# Patient Record
Sex: Male | Born: 1965 | Race: White | Hispanic: No | Marital: Married | Smoking: Never smoker
Health system: Southern US, Community
[De-identification: ages and names within clinical notes are randomized; demographics above are authoritative.]

## PROBLEM LIST (undated history)

## (undated) DIAGNOSIS — E119 Type 2 diabetes mellitus without complications: Secondary | ICD-10-CM

## (undated) DIAGNOSIS — I10 Essential (primary) hypertension: Secondary | ICD-10-CM

## (undated) HISTORY — DX: Essential (primary) hypertension: I10

## (undated) HISTORY — DX: Type 2 diabetes mellitus without complications: E11.9

---

## 2006-02-09 ENCOUNTER — Encounter
Admission: RE | Admit: 2006-02-09 | Discharge: 2006-05-10 | Payer: Self-pay | Admitting: Physical Medicine and Rehabilitation

## 2006-05-11 ENCOUNTER — Encounter
Admission: RE | Admit: 2006-05-11 | Discharge: 2006-08-09 | Payer: Self-pay | Admitting: Physical Medicine and Rehabilitation

## 2006-08-12 ENCOUNTER — Encounter
Admission: RE | Admit: 2006-08-12 | Discharge: 2006-08-17 | Payer: Self-pay | Admitting: Physical Medicine and Rehabilitation

## 2008-01-06 ENCOUNTER — Encounter: Admission: RE | Admit: 2008-01-06 | Discharge: 2008-01-06 | Payer: Self-pay | Admitting: Internal Medicine

## 2010-10-21 ENCOUNTER — Other Ambulatory Visit (INDEPENDENT_AMBULATORY_CARE_PROVIDER_SITE_OTHER): Payer: Self-pay

## 2010-10-21 ENCOUNTER — Telehealth (INDEPENDENT_AMBULATORY_CARE_PROVIDER_SITE_OTHER): Payer: Self-pay

## 2010-10-21 MED ORDER — HYDROCODONE-ACETAMINOPHEN 5-325 MG PO TABS: 1.0000 | ORAL_TABLET | Freq: Four times a day (QID) | ORAL | Status: DC | PRN

## 2010-10-29 ENCOUNTER — Other Ambulatory Visit (INDEPENDENT_AMBULATORY_CARE_PROVIDER_SITE_OTHER): Payer: Self-pay | Admitting: General Surgery

## 2010-10-29 MED ORDER — HYDROCODONE-ACETAMINOPHEN 10-325 MG PO TABS: 1.0000 | ORAL_TABLET | Freq: Four times a day (QID) | ORAL | Status: DC | PRN

## 2010-10-29 MED ORDER — HYDROCODONE-ACETAMINOPHEN 5-325 MG PO TABS: 1.0000 | ORAL_TABLET | Freq: Four times a day (QID) | ORAL | Status: AC | PRN

## 2010-10-30 ENCOUNTER — Other Ambulatory Visit (INDEPENDENT_AMBULATORY_CARE_PROVIDER_SITE_OTHER): Payer: Self-pay

## 2011-01-03 NOTE — Telephone Encounter (Signed)
Encounter resolved.

## 2011-02-18 ENCOUNTER — Ambulatory Visit: Payer: Self-pay | Admitting: Internal Medicine

## 2011-03-15 ENCOUNTER — Ambulatory Visit: Payer: Self-pay | Admitting: Internal Medicine

## 2011-04-15 ENCOUNTER — Ambulatory Visit: Payer: Self-pay | Admitting: Internal Medicine

## 2012-07-06 ENCOUNTER — Ambulatory Visit: Payer: Self-pay | Admitting: General Practice

## 2012-07-07 ENCOUNTER — Encounter: Payer: Self-pay | Admitting: Physician Assistant

## 2012-07-13 ENCOUNTER — Encounter: Payer: Self-pay | Admitting: Physician Assistant

## 2014-02-18 ENCOUNTER — Encounter: Payer: 59 | Attending: Internal Medicine

## 2014-02-18 VITALS — Ht 69.0 in | Wt 213.8 lb

## 2014-02-18 DIAGNOSIS — Z713 Dietary counseling and surveillance: Secondary | ICD-10-CM | POA: Diagnosis not present

## 2014-02-18 DIAGNOSIS — E119 Type 2 diabetes mellitus without complications: Secondary | ICD-10-CM | POA: Diagnosis present

## 2014-02-18 NOTE — Progress Notes (Signed)
Patient was seen on 02/18/14 for the complete diabetes self-management series at the [facility]. This is a part of the Link to [employer].  Handouts given during class include:  Living Well with Diabetes book  Carb Counting and Meal Planning book  Meal Plan Card  Carbohydrate guide  Meal planning worksheet  Low Sodium Flavoring Tips  The diabetes portion plate  Low Carbohydrate Snack Suggestions  A1c to eAG Conversion Chart  Diabetes Medications  Stress Management  Diabetes Recommended Care Schedule  Diabetes Success Plan  Core Class Satisfaction Survey  The following learning objectives were met by the patient during this course:  Describe diabetes  State some common risk factors for diabetes  Defines the role of glucose and insulin  Identifies type of diabetes and pathophysiology  Describe the relationship between diabetes and cardiovascular risk  State the members of the Healthcare Team  States the rationale for glucose monitoring  State when to test glucose  State their individual Target Range  State the importance of logging glucose readings  Describe how to interpret glucose readings  Identifies A1C target  Explain the correlation between A1c and eAG values  State symptoms and treatment of high blood glucose  State symptoms and treatment of low blood glucose  Explain proper technique for glucose testing  Identifies proper sharps disposal  Describe the role of different macronutrients on glucose  Explain how carbohydrates affect blood glucose  State what foods contain the most carbohydrates  Demonstrate carbohydrate counting  Demonstrate how to read Nutrition Facts food label  Describe effects of various fats on heart health  Describe the importance of good nutrition for health and healthy eating strategies  Describe techniques for managing your shopping, cooking and meal planning  List  strategies to follow meal plan when dining out  Describe the effects of alcohol on glucose and how to use it safely . State the amount of activity recommended for healthy living . Describe activities suitable for individual needs . Identify ways to regularly incorporate activity into daily life . Identify barriers to activity and ways to over come these barriers  Identify diabetes medications being personally used and their primary action for lowering glucose and possible side effects . Describe role of stress on blood glucose and develop strategies to address psychosocial issues . Identify diabetes complications and ways to prevent them  Explain how to manage diabetes during illness . Evaluate success in meeting personal goal . Establish 2-3 goals that they will plan to diligently work on until they return for the  87-monthfollow-up visit  Goals:   I will be active 5 times a week  I will test my glucose at least 1 times a day, 7 days a week  Start keeping glucose log  Your patient has identified these potential barriers to change:  Need new glucometer  Your patient has identified their diabetes self-care support plan as  Phone app    Plan: Follow up with Link to [facility]

## 2014-06-19 ENCOUNTER — Ambulatory Visit: Payer: 59

## 2014-08-28 ENCOUNTER — Ambulatory Visit: Payer: Self-pay | Admitting: *Deleted

## 2014-09-18 ENCOUNTER — Ambulatory Visit: Payer: Self-pay | Admitting: *Deleted

## 2014-11-07 ENCOUNTER — Other Ambulatory Visit: Payer: Self-pay | Admitting: *Deleted

## 2014-11-07 VITALS — BP 122/82 | Ht 69.0 in | Wt 208.8 lb

## 2014-11-07 DIAGNOSIS — E1165 Type 2 diabetes mellitus with hyperglycemia: Secondary | ICD-10-CM

## 2014-11-07 LAB — POCT GLYCOSYLATED HEMOGLOBIN (HGB A1C): HEMOGLOBIN A1C: 9

## 2014-11-08 ENCOUNTER — Encounter: Payer: Self-pay | Admitting: *Deleted

## 2014-11-08 NOTE — Patient Outreach (Signed)
[facility]) Care Management   11/07/14  Keith Trevino [birth year] [id number]  Keith Trevino is an 49 y.o. male who presents for routine link To Wellness follow up for self management assistance with Type II DM, HTN and hyperlipidemia.  Subjective:  Keith Trevino says he saw Dr. Donette Larry in early June and was started on Bydureon. He says he is tolerating it with mild GI distress. He is requesting a POC A1C to see if Bydureon has improved his glycemic control. He says he continues to struggle with following a CHO controlled meal plan, checking his blood sugars, and exercising.  Objective:   Review of Systems  Constitutional: Negative.    Filed Weights   11/07/14 1616  Weight: 208 lb 12.8 oz (94.711 kg)   Filed Vitals:   11/07/14 1616  BP: 122/82  POC A1C= 9.0% POC premeal CBG= 167  Physical Exam  Constitutional: He is oriented to person, place, and time.  Neurological: He is alert and oriented to person, place, and time.  Skin: Skin is warm and dry.  Psychiatric: He has a normal mood and affect. His behavior is normal. Thought content normal.    Current Medications:   Current Outpatient Prescriptions  Medication Sig Dispense Refill  . aspirin 81 MG tablet Take 81 mg by mouth daily.    . Exenatide ER (BYDUREON) 2 MG PEN Inject 2 mg into the skin once a week.    . fluticasone (FLONASE) 50 MCG/ACT nasal spray Place 1 spray into both nostrils daily.    Marland Kitchen GLIMEPIRIDE PO Take 4 mg by mouth daily.     Marland Kitchen ibuprofen (ADVIL,MOTRIN) 200 MG tablet Take 200 mg by mouth every 6 (six) hours as needed (two tablets). Two tablets    . metFORMIN (GLUCOPHAGE) 500 MG tablet Take 500 mg by mouth 2 (two) times daily with a meal.    . Multiple Vitamin (MULTIVITAMIN) tablet Take 1 tablet by mouth daily.    Marland Kitchen RAMIPRIL PO Take 5 mg by mouth daily.     . simvastatin (ZOCOR) 10 MG tablet Take 10 mg by mouth daily.    . sitaGLIPtin (JANUVIA) 100 MG tablet Take 100 mg by mouth daily.    .  pioglitazone (ACTOS) 15 MG tablet Take 15 mg by mouth daily.    . sitaGLIPtin-metformin (JANUMET) 50-500 MG per tablet Take 1 tablet by mouth 2 (two) times daily with a meal.     No current facility-administered medications for this visit.    Functional Status:   In your present state of health, do you have any difficulty performing the following activities: 11/07/2014  Hearing? N  Vision? N  Difficulty concentrating or making decisions? N  Walking or climbing stairs? N  Dressing or bathing? N  Doing errands, shopping? N    Fall/Depression Screening:    PHQ 2/9 Scores 11/07/2014 02/18/2014  PHQ - 2 Score 0 0   [facility] CM Care Plan Problem One        Patient Outreach from 11/07/2014 in [facility] [employer]   Care Plan Problem One  Type II DM not meeting A1C target as evidenced by POC A1C= 9.0% on 11/07/14, hyperlipidemia and HTN- meeting treatment targets   Care Plan for Problem One  Active   [facility]N Long Term Goal (31-90 days)  Improved glycemic control as evidenced by improved  A1C (<9.0%) at next assessment, ongoing good control of HTN and hyperlipidemia as evidenced by meeting treatment targets at future assessments   [facility] Long  Term Goal Start Date  11/07/14   Interventions for Problem One Long Term Goal  Using a picture representation, discussed the 8 core pathophysiologic deficits in Type II diabetes. Reviewed physiology of diabetes as a chronic progressive disease with the initial problem of insulin resistance in the muscle, liver and fat cells and then increased loss of beta cell function over time resulting in decreased insulin production, reviewed role of obesity, especially abdominal (visceral) obesity, on insulin resistance, reviewed patient medications, reviewed DM medications of Januvia, Metformin, Glimepiride and extensive discussion of Bydureon including the mechanism of action, common side effects, dosages and dosing schedule, reinforced importance of taking all medications as  prescribed, discussed the need for the use of a combination of DM medications to correct the pathophysiologic core deficits and to  prevent or slow beta cell failure, discussed role of statins, aspirin, and blood pressure medicines in the treatment of diabetes, reviewed recommended target ranges for pre-meal and post-meal, informed patient that True Metrix meter and supplies will replace the True Result in the Link To Wellness program, discussed results of today's POC A1C, A1C goal, and correlation to estimated average glucose, reviewed upcoming appointments with patient's primary care MD and reinforced the importance of keeping the appointment,  will arrange Link to Wellness follow up after Keith Trevino sees Dr Donette Larry on 12/06/14     Assessment:   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  Plan:  RNCM to fax today's office visit note to Dr. Donette Larry. RNCM will meet at least quarterly and as needed with patient per Link To Wellness program guidelines to assist with Type II DM, HTN, and hyperlipidemia self-management and assess patient's progress toward mutually set goals.  [name] RN,CCM,CDE [facility] Care Management Coordinator Link To Wellness Office Phone [phone] Office Fax [phone]

## 2014-11-13 ENCOUNTER — Encounter: Payer: Self-pay | Admitting: *Deleted

## 2015-01-04 ENCOUNTER — Other Ambulatory Visit: Payer: Self-pay | Admitting: *Deleted

## 2015-01-04 NOTE — Patient Outreach (Signed)
Spoke with Keith Trevino via his mobile phone. He says he did not tolerate the Trulicity either due to abdominal pain so he has an appointment to see Dr. Leslie Dales in October. This RNCM requested he call with an update after he sees endocrinologist so that timing of Link To Wellness follow up can be determined and arranged. [name] RN,CCM,CDE [facility] Care Management Coordinator Link To Wellness Office Phone [phone] Office Fax [phone]

## 2015-01-04 NOTE — Patient Outreach (Signed)
[name] saw Dr. Donette Larry for complaints of abdominal pain after starting Bydureon in early June. Bydureon was stopped and patient was given samples of Trulicity to start once weekly. He is to see Dr. Donette Larry in follow up in 3 weeks. [name] RN,CCM,CDE [facility] Care Management Coordinator Link To Wellness Office Phone [phone] Office Fax [phone]

## 2015-01-04 NOTE — Addendum Note (Signed)
Addended by: [name] on: 01/04/2015 04:52 AM   Modules accepted: Medications

## 2015-02-20 ENCOUNTER — Other Ambulatory Visit: Payer: Self-pay | Admitting: *Deleted

## 2015-02-20 NOTE — Patient Outreach (Signed)
Spoke with Keith Trevino via mobile phone to get update on his treatment plan after he saw Dr. Leslie DalesAltheimer for the first time. Says he was started on Tanzeum 30 mg SQ every week and he took his first dose yesterday. He will return to see Dr. Leslie DalesAltheimer on 03/18/15. Will call him for update after he sees MD on 03/18/15. Bary RichardJanet S. Hauser RN,CCM,CDE [facility] Care Management Coordinator Link To Wellness Office Phone [phone] Office Fax [phone]

## 2015-03-21 ENCOUNTER — Other Ambulatory Visit: Payer: Self-pay | Admitting: *Deleted

## 2015-03-21 NOTE — Patient Outreach (Signed)
Spoke with Keith Trevino by phone to get update on his treatment plan for Type II DM since he saw Dr. Leslie DalesAltheimer on 12/6. Keith Trevino says he was told the Tanzeum , that was started on 11/7,  is not helping his glycemic control so Dr. Leslie DalesAltheimer placed a continuous glucose monitor and Keith Trevino will return in 2 weeks. Reviewed Link To Wellness benefit with Keith Trevino regarding 100% coverage of the device should he and his MD decided this would be of benefit. Will plan to call Keith Trevino in 2 weeks for another update. Bary RichardJanet S. Madason Rauls RN,CCM,CDE [facility] Care Management Coordinator Link To Wellness Office Phone [phone] Office Fax [phone]

## 2015-05-01 MED FILL — TANZEUM 50 MG PEN INJECT: 50 | 30 days supply | Qty: 4 | Fill #2

## 2015-05-07 ENCOUNTER — Other Ambulatory Visit: Payer: Self-pay

## 2015-05-07 NOTE — Patient Outreach (Signed)
[facility]) Care Management  05/07/2015  Keith Trevino 26-Nov-1965 [id number]  [name] called me by phone to discuss recent medications his endocrinologist has ordered and future plan of care.  He has not heard back from his MD since the beginning of December- he's frustrated and asking for advice.  1. Call MD and schedule a follow up appointment to review continuous glucose monitor results 2. If MD retries a GLP-1 Agonist, ask for an anti-emetic such as Zofran 3. Ask MD about Theodis Sato which is available to [facility] employees at 0 cost with a pharmacy coupon- cannot access labs     to determine kidney function- will need to discuss with MD   Susette Racer, RN, BA, MHA, CDE [facility] Diabetes Coordinator- Link To Wellness Direct Dial:  [phone]  Fax:  [phone] E-mail: [email or web address] [address], [place], [place]  [address]

## 2015-05-08 MED FILL — GLIMEPIRIDE 4 MG TABLET: 4 | 30 days supply | Qty: 60 | Fill #3

## 2015-05-08 MED FILL — SIMVASTATIN 10 MG TABLET: 10 | 90 days supply | Qty: 90 | Fill #1

## 2015-05-08 MED FILL — JANUVIA 100 MG TABLET: 100 | 90 days supply | Qty: 90 | Fill #1

## 2015-05-15 DIAGNOSIS — E1165 Type 2 diabetes mellitus with hyperglycemia: Secondary | ICD-10-CM | POA: Diagnosis not present

## 2015-05-15 DIAGNOSIS — I10 Essential (primary) hypertension: Secondary | ICD-10-CM | POA: Diagnosis not present

## 2015-05-15 DIAGNOSIS — E559 Vitamin D deficiency, unspecified: Secondary | ICD-10-CM | POA: Diagnosis not present

## 2015-05-15 DIAGNOSIS — E7800 Pure hypercholesterolemia, unspecified: Secondary | ICD-10-CM | POA: Diagnosis not present

## 2015-05-18 ENCOUNTER — Other Ambulatory Visit: Payer: Self-pay | Admitting: *Deleted

## 2015-05-18 NOTE — Patient Outreach (Addendum)
Spoke with [name] to arrange Link To Wellness follow up. States he saw Dr. Leslie Dales and was started on Invokana 100 mg x 2 weeks then 300 mg x 2 weeks. He will see MD again at the end of February. Scheduled Link To Wellness follow up appointment on 06/19/15.  [name] RN,CCM,CDE [facility] Care Management Coordinator Link To Wellness Office Phone [phone] Office Fax [phone]

## 2015-06-04 MED FILL — GLIMEPIRIDE 4 MG TABLET: 4 | 30 days supply | Qty: 60 | Fill #4

## 2015-06-04 MED FILL — RAMIPRIL 5 MG CAPSULE: 5 | 90 days supply | Qty: 90 | Fill #0

## 2015-06-04 MED FILL — TANZEUM 50 MG PEN INJECT: 50 | 30 days supply | Qty: 4 | Fill #3

## 2015-06-06 MED FILL — INVOKANA 300 MG TABLET: 300 | 30 days supply | Qty: 30 | Fill #0

## 2015-06-19 ENCOUNTER — Encounter: Payer: Self-pay | Admitting: *Deleted

## 2015-06-19 ENCOUNTER — Other Ambulatory Visit: Payer: Self-pay | Admitting: *Deleted

## 2015-06-19 ENCOUNTER — Ambulatory Visit: Payer: Self-pay | Admitting: *Deleted

## 2015-06-19 NOTE — Patient Outreach (Signed)
[facility]) Care Management   06/19/2015  Keith Trevino 08-25-1965 [id number]  Keith Pelimothy J Beman is an 50 y.o. male who [facility] office for routine Link To Wellness follow up for self management assistance with Type II DM and hyperlipidemia.  Subjective:  Keith Trevino attributes his weight loss of 9 lbs to healthier food choices and the effects of the new DM medications of Tanzeum and Invokana. He says he will have lab work drawn on 3/20 and then see Dr. Joyce GrossAlthiemer later that week.  Objective:   Review of Systems  Constitutional: Negative.     Physical Exam  Constitutional: He is oriented to person, place, and time. He appears well-developed and well-nourished.  Respiratory: Effort normal.  Neurological: He is alert and oriented to person, place, and time.  Skin: Skin is warm and dry.  Psychiatric: He has a normal mood and affect. His behavior is normal. Judgment and thought content normal.   Filed Vitals:   06/19/15 1201  BP: 110/84   Filed Weights   06/19/15 1201  Weight: 199 lb (90.266 kg)   Current Medications:   Current Outpatient Prescriptions  Medication Sig Dispense Refill  . Albiglutide (TANZEUM) 30 MG PEN Inject 30 mg into the skin.    Marland Kitchen. aspirin 81 MG tablet Take 81 mg by mouth daily.    . canagliflozin (INVOKANA) 100 MG TABS tablet Take 300 mg by mouth daily before breakfast.     . fluticasone (FLONASE) 50 MCG/ACT nasal spray Place 1 spray into both nostrils daily.    Marland Kitchen. GLIMEPIRIDE PO Take 4 mg by mouth 2 (two) times daily.     Marland Kitchen. ibuprofen (ADVIL,MOTRIN) 200 MG tablet Take 200 mg by mouth every 6 (six) hours as needed (two tablets). Two tablets    . metFORMIN (GLUCOPHAGE) 500 MG tablet Take 500 mg by mouth 2 (two) times daily with a meal.    . Multiple Vitamin (MULTIVITAMIN) tablet Take 1 tablet by mouth daily.    Marland Kitchen. RAMIPRIL PO Take 5 mg by mouth daily.     . simvastatin (ZOCOR) 10 MG  tablet Take 10 mg by mouth daily.    . sitaGLIPtin (JANUVIA) 100 MG tablet Take 100 mg by mouth daily.     No current facility-administered medications for this visit.    Functional Status:   In your present state of health, do you have any difficulty performing the following activities: 06/19/2015 11/07/2014  Hearing? N N  Vision? N N  Difficulty concentrating or making decisions? N N  Walking or climbing stairs? N N  Dressing or bathing? N N  Doing errands, shopping? N N    Fall/Depression Screening:    PHQ 2/9 Scores 11/07/2014 02/18/2014  PHQ - 2 Score 0 0    Assessment:   [employer] employee and Link To Wellness member with Type II DM, and hyperlipidemia. Last POC Hgb A1C= 9.0% on 11/07/14, lipid profile of 05/22/14 normal.  [facility]  [facility]        Most Recent Value   Care Plan Problem One  Type II DM not meeting A1C target as evidenced by POC Hgb A1C= 9.0% on 11/07/14, hyperlipidemia and HTN- meeting treatment targets   Role Documenting the Problem One  Care Management Coordinator   Care Plan for Problem One  Active   [facility] Long Term Goal (31-90 days)  Improved glycemic control as evidenced by improved  Hgb A1C (<9.0%)  at next assessment, ongoing good control of HTN and hyperlipidemia as evidenced by meeting treatment targets at future assessments   [facility] Long Term Goal Start Date  06/19/15   Interventions for Problem One Long Term Goal  Using a diagram  of DeFronzo'a Ominous Octet, reviewed the 8 core pathophysiologic deficits in Type II diabetes,  reviewed role of obesity, especially abdominal (visceral) obesity, on insulin resistance, congratulated Keith Trevino on his weight loss and discussed strategies for ongoing weight loss, reviewed patient medications, reviewed DM medications of Januvia, Metformin, Glimepiride, Tanzeum  and Invokana including the mechanism of action, common side effects, dosages and dosing schedule, reinforced importance of taking all medications as prescribed,  discussed the need for the use of a combination of DM medications to correct the pathophysiologic core deficits and to  prevent or slow beta cell failure, reviewed glucometer history and discussed outliers and strategies to reduce post meal glucose spikes, reviewed recommended target ranges for pre-meal and post-meal, reviewed upcoming appointment with endocrinologist and requested he e-mail results of labs and any changes to his DM treatment  plan  to this RNCM, reinforced the importance of keeping the appointment,  will arrange Link to Wellness follow up after Keith Trevino sees Dr Altheimer in 2  weeks     RNCM to fax today's office visit note to Dr. Donette Larry. RNCM will meet quarterly and as needed with patient per Link To Wellness program guidelines to assist with Type II DM and hyperlipidemia self-management and assess patient's progress toward mutually set goals.

## 2015-06-25 DIAGNOSIS — E1165 Type 2 diabetes mellitus with hyperglycemia: Secondary | ICD-10-CM | POA: Diagnosis not present

## 2015-07-02 DIAGNOSIS — E559 Vitamin D deficiency, unspecified: Secondary | ICD-10-CM | POA: Diagnosis not present

## 2015-07-02 DIAGNOSIS — E7800 Pure hypercholesterolemia, unspecified: Secondary | ICD-10-CM | POA: Diagnosis not present

## 2015-07-02 DIAGNOSIS — E1165 Type 2 diabetes mellitus with hyperglycemia: Secondary | ICD-10-CM | POA: Diagnosis not present

## 2015-07-02 DIAGNOSIS — I10 Essential (primary) hypertension: Secondary | ICD-10-CM | POA: Diagnosis not present

## 2015-07-02 MED FILL — GLIMEPIRIDE 4 MG TABLET: 4 | 30 days supply | Qty: 60 | Fill #5

## 2015-07-02 MED FILL — TANZEUM 50 MG PEN INJECT: 50 | 30 days supply | Qty: 4 | Fill #4

## 2015-07-12 MED FILL — INVOKANA 300 MG TABLET: 300 | 90 days supply | Qty: 90 | Fill #1

## 2015-07-12 MED FILL — metFORMIN HCL 500 MG TABS: 500 | 90 days supply | Qty: 180 | Fill #0

## 2015-07-30 MED FILL — TANZEUM 50 MG PEN INJECT: 50 | 30 days supply | Qty: 4 | Fill #5

## 2015-08-03 MED FILL — GLIMEPIRIDE 4 MG TABLET: 4 | 30 days supply | Qty: 60 | Fill #0

## 2015-08-03 MED FILL — JANUVIA 100 MG TABLET: 100 | 90 days supply | Qty: 90 | Fill #0

## 2015-08-31 MED FILL — TANZEUM 50 MG PEN INJECT: 50 | 30 days supply | Qty: 4 | Fill #6

## 2015-08-31 MED FILL — GLIMEPIRIDE 4 MG TABLET: 4 | 30 days supply | Qty: 60 | Fill #1

## 2015-09-04 DIAGNOSIS — H40052 Ocular hypertension, left eye: Secondary | ICD-10-CM | POA: Diagnosis not present

## 2015-09-04 DIAGNOSIS — H40051 Ocular hypertension, right eye: Secondary | ICD-10-CM | POA: Diagnosis not present

## 2015-09-11 MED FILL — RAMIPRIL 5 MG CAPSULE: 5 | 90 days supply | Qty: 90 | Fill #1

## 2015-09-11 MED FILL — SIMVASTATIN 10 MG TABLET: 10 | 90 days supply | Qty: 90 | Fill #0

## 2015-09-21 ENCOUNTER — Other Ambulatory Visit: Payer: Self-pay | Admitting: *Deleted

## 2015-09-21 NOTE — Patient Outreach (Signed)
Spoke with Keith Trevino via mobile phone to arrange Link To Wellness follow up. He states he missed his last appointment with Keith Trevino because he was at the beach. He says he is over due to see Keith Trevino so he will call and make an appointment with him for this month and then provide this RNCM with an update to determine timing of the next Link To Wellness visit. Bary RichardJanet S. Hauser RN,CCM,CDE [facility] Care Management Coordinator Link To Wellness Office Phone [phone] Office Fax [phone]

## 2015-09-24 MED FILL — TANZEUM 50 MG PEN INJECT: 50 | 30 days supply | Qty: 4 | Fill #7

## 2015-10-04 DIAGNOSIS — E1165 Type 2 diabetes mellitus with hyperglycemia: Secondary | ICD-10-CM | POA: Diagnosis not present

## 2015-10-04 DIAGNOSIS — I10 Essential (primary) hypertension: Secondary | ICD-10-CM | POA: Diagnosis not present

## 2015-10-04 DIAGNOSIS — E7800 Pure hypercholesterolemia, unspecified: Secondary | ICD-10-CM | POA: Diagnosis not present

## 2015-10-05 DIAGNOSIS — I10 Essential (primary) hypertension: Secondary | ICD-10-CM | POA: Diagnosis not present

## 2015-10-05 DIAGNOSIS — E559 Vitamin D deficiency, unspecified: Secondary | ICD-10-CM | POA: Diagnosis not present

## 2015-10-05 DIAGNOSIS — E1165 Type 2 diabetes mellitus with hyperglycemia: Secondary | ICD-10-CM | POA: Diagnosis not present

## 2015-10-05 DIAGNOSIS — E7800 Pure hypercholesterolemia, unspecified: Secondary | ICD-10-CM | POA: Diagnosis not present

## 2015-10-10 MED FILL — INVOKANA 300 MG TABLET: 300 | 90 days supply | Qty: 90 | Fill #2

## 2015-10-10 MED FILL — metFORMIN HCL 500 MG TABS: 500 | 90 days supply | Qty: 180 | Fill #1

## 2015-10-22 MED FILL — TANZEUM 50 MG PEN INJECT: 50 | 30 days supply | Qty: 4 | Fill #8

## 2015-10-25 MED FILL — GLIMEPIRIDE 4 MG TABLET: 4 | 30 days supply | Qty: 60 | Fill #2

## 2015-10-26 MED FILL — JANUVIA 100 MG TABLET: 100 | 90 days supply | Qty: 90 | Fill #1

## 2015-11-08 ENCOUNTER — Ambulatory Visit: Payer: Self-pay | Admitting: *Deleted

## 2015-11-09 ENCOUNTER — Ambulatory Visit: Payer: Self-pay | Admitting: *Deleted

## 2015-11-14 ENCOUNTER — Ambulatory Visit: Payer: Self-pay | Admitting: *Deleted

## 2015-11-19 MED FILL — TANZEUM 50 MG PEN INJECT: 50 | 30 days supply | Qty: 4 | Fill #9

## 2015-11-23 ENCOUNTER — Other Ambulatory Visit: Payer: Self-pay | Admitting: *Deleted

## 2015-11-23 ENCOUNTER — Ambulatory Visit: Payer: Self-pay | Admitting: *Deleted

## 2015-11-27 NOTE — Patient Outreach (Signed)
[facility]) Care Management   11/23/2015  Keith Trevino 1965/09/20 [id number]  Keith Trevino is an 50 y.o. male who presents to the [address] [facility] Care Management office for routine Link To Wellness follow up for self management assistance with Type II DM, HTN, hyperlipidemia and obesity.  Subjective: Keith Trevino says he will be taking a family medical leave from work to care for his elderly Mother as she needs 24 hour care due to multiple chronic health issues, including a left below the knee amputation.  [facility] RN and CSW to help him secure resources for his mother before he returns back to work.  He says he saw Dr. Leslie DalesAltheimer on June 23 rd and he brings the after visit summary and lab results with him. He says his eye exam will be due in November.   Objective:   Review of Systems  Constitutional: Negative.    Filed Weights   11/23/15 0839  Weight: 197 lb 12.8 oz (89.7 kg)   Vitals:   11/23/15 0839  BP: 102/70   Physical Exam  Constitutional: He is oriented to person, place, and time. He appears well-developed and well-nourished.  Respiratory: Effort normal.  Neurological: He is alert and oriented to person, place, and time.  Skin: Skin is warm and dry.  Psychiatric: He has a normal mood and affect. His behavior is normal. Judgment and thought content normal.    Encounter Medications:   Outpatient Encounter Prescriptions as of 11/23/2015  Medication Sig Note  . Albiglutide (TANZEUM) 30 MG PEN Inject 30 mg into the skin. 02/20/2015: Took first dose on 02/19/15  . aspirin 81 MG tablet Take 81 mg by mouth daily.   . canagliflozin (INVOKANA) 100 MG TABS tablet Take 300 mg by mouth daily before breakfast.  05/18/2015: Started on 05/15/15 and will take 100 mg x 2 weeks then increase to 300 mg x 2 weeks.  . fluticasone (FLONASE) 50 MCG/ACT nasal spray Place 1 spray into both nostrils daily. 06/19/2015: Uses prn  .  GLIMEPIRIDE PO Take 4 mg by mouth 2 (two) times daily.  06/19/2015: Takes twice daily  . ibuprofen (ADVIL,MOTRIN) 200 MG tablet Take 200 mg by mouth every 6 (six) hours as needed (two tablets). Two tablets   . metFORMIN (GLUCOPHAGE) 500 MG tablet Take 500 mg by mouth 2 (two) times daily with a meal.   . Multiple Vitamin (MULTIVITAMIN) tablet Take 1 tablet by mouth daily.   Marland Kitchen. RAMIPRIL PO Take 5 mg by mouth daily.  11/08/2014: Dose is correct  . simvastatin (ZOCOR) 10 MG tablet Take 10 mg by mouth daily.   . sitaGLIPtin (JANUVIA) 100 MG tablet Take 100 mg by mouth daily.   . Dulaglutide (TRULICITY) 0.75 MG/0.5ML SOPN Inject 0.75 mg into the skin once a week. Reported on 06/19/2015 01/04/2015: Did not tolerate due to abdominal pain  . Exenatide ER (BYDUREON) 2 MG PEN Inject 2 mg into the skin once a week. Reported on 06/19/2015 01/04/2015: Caused abdominal pain  . pioglitazone (ACTOS) 15 MG tablet Take 15 mg by mouth daily. Reported on 06/19/2015 11/08/2014: Med stopped September 21, 2014  . sitaGLIPtin-metformin (JANUMET) 50-500 MG per tablet Take 1 tablet by mouth 2 (two) times daily with a meal. Reported on 06/19/2015 11/08/2014: Takes Metformin and Januvia    No facility-administered encounter medications on file as of 11/23/2015.     Functional Status:   In your present state of health,  do you have any difficulty performing the following activities: 11/23/2015 06/19/2015  Hearing? N N  Vision? N N  Difficulty concentrating or making decisions? N N  Walking or climbing stairs? N N  Dressing or bathing? N N  Doing errands, shopping? N N  Some recent data might be hidden    Fall/Depression Screening:    PHQ 2/9 Scores 11/23/2015 11/07/2014 02/18/2014  PHQ - 2 Score 0 0 0    Assessment:  [employer] employee and Link To Wellness member not meeting Hgb A1C target of <7.0% for DM, obese with no weight gain since last visit,  meeting treatment targets for hyperlipidemia but all elements of lipid panel slightly higher  than previous panel.  Plan:   [facility] Care [facility] Problem One   Flowsheet Row Most Recent Value  Care Plan Problem One  Type II DM not meeting A1C target as evidenced by POC Hgb A1C=  7.4% on 10/04/15 , hyperlipidemia and HTN- meeting treatment targets, obese with no weight gain since last visit  Role Documenting the Problem One  Care Management Coordinator  Care Plan for Problem One  Active  [facility] Long Term Goal (31-90 days)  Improved glycemic control as evidenced by improved  Hgb A1C (<7.0%) at next assessment, ongoing good control of HTN and hyperlipidemia as evidenced by meeting treatment targets at future assessments, evidence of weight loss or no weight gain at next assessment.  [facility] Long Term Goal Start Date  11/23/15  Interventions for Problem One Long Term Goal discussed his mother's health issues and his concern for her care when he returns to work in 12 weeks, assessed community resources provided by [facility] CM community staff,  reviewed  strategies for weight loss, reviewed patient medications and assessed adherence,  reviewed results of labs done 6/22 and advised him though his lipid panel is still meeting target, all elements have increased, dicussed strategies to lower LDL and total cholesterol,  reviewed upcoming appointment with endocrinologist on 9/28, and requested he e-mail results of labs and any changes to his DM treatment  plan  to this RNCM, reinforced the importance of keeping the appointment, will arrange Link to Wellness follow up after Keith Trevino returns to work     [facility] today's office visit note to Dr. Leslie DalesAltheimer and Dr. Donette LarryHusain. RNCM will meet quarterly with patient to assist with Type II DM, HTN, hyperlipidemia and obesity self-management and assess patient's progress toward mutually set goals. Bary RichardJanet S. Satoya Feeley RN,CCM,CDE [facility] Care Management Coordinator Link To Wellness Office Phone [phone] Office Fax [phone]

## 2015-11-29 MED FILL — GLIMEPIRIDE 4 MG TABLET: 4 | 30 days supply | Qty: 60 | Fill #3

## 2015-12-18 MED FILL — RAMIPRIL 5 MG CAPSULE: 5 | 90 days supply | Qty: 90 | Fill #2

## 2015-12-18 MED FILL — SIMVASTATIN 10 MG TABLET: 10 | 90 days supply | Qty: 90 | Fill #1

## 2015-12-18 MED FILL — TANZEUM 50 MG PEN INJECT: 50 | 30 days supply | Qty: 4 | Fill #10

## 2015-12-28 MED FILL — GLIMEPIRIDE 4 MG TABLET: 4 | 30 days supply | Qty: 60 | Fill #0

## 2015-12-31 MED FILL — INVOKANA 300 MG TABLET: 300 | 90 days supply | Qty: 90 | Fill #3

## 2016-01-07 DIAGNOSIS — E1165 Type 2 diabetes mellitus with hyperglycemia: Secondary | ICD-10-CM | POA: Diagnosis not present

## 2016-01-07 DIAGNOSIS — E7800 Pure hypercholesterolemia, unspecified: Secondary | ICD-10-CM | POA: Diagnosis not present

## 2016-01-10 DIAGNOSIS — E7800 Pure hypercholesterolemia, unspecified: Secondary | ICD-10-CM | POA: Diagnosis not present

## 2016-01-10 DIAGNOSIS — E559 Vitamin D deficiency, unspecified: Secondary | ICD-10-CM | POA: Diagnosis not present

## 2016-01-10 DIAGNOSIS — E1165 Type 2 diabetes mellitus with hyperglycemia: Secondary | ICD-10-CM | POA: Diagnosis not present

## 2016-01-10 DIAGNOSIS — I10 Essential (primary) hypertension: Secondary | ICD-10-CM | POA: Diagnosis not present

## 2016-01-14 MED FILL — TANZEUM 50 MG PEN INJECT: 50 | 30 days supply | Qty: 4 | Fill #11

## 2016-01-24 MED FILL — metFORMIN HCL 500 MG TABS: 500 | 90 days supply | Qty: 180 | Fill #2

## 2016-01-29 MED FILL — JANUVIA 100 MG TABLET: 100 | 60 days supply | Qty: 60 | Fill #2

## 2016-01-29 MED FILL — GLIMEPIRIDE 4 MG TABLET: 4 | 60 days supply | Qty: 120 | Fill #1

## 2016-02-11 MED FILL — TANZEUM 50 MG PEN INJECT: 50 | 28 days supply | Qty: 4 | Fill #0

## 2016-02-15 ENCOUNTER — Ambulatory Visit (INDEPENDENT_AMBULATORY_CARE_PROVIDER_SITE_OTHER): Payer: 59 | Admitting: Family Medicine

## 2016-02-15 ENCOUNTER — Encounter: Payer: Self-pay | Admitting: Family Medicine

## 2016-02-15 VITALS — BP 127/84 | HR 76 | Temp 98.1°F | Resp 16 | Ht 68.5 in | Wt 202.2 lb

## 2016-02-15 DIAGNOSIS — H66003 Acute suppurative otitis media without spontaneous rupture of ear drum, bilateral: Secondary | ICD-10-CM | POA: Diagnosis not present

## 2016-02-15 DIAGNOSIS — H44003 Unspecified purulent endophthalmitis, bilateral: Secondary | ICD-10-CM

## 2016-02-15 DIAGNOSIS — B9789 Other viral agents as the cause of diseases classified elsewhere: Secondary | ICD-10-CM

## 2016-02-15 DIAGNOSIS — J069 Acute upper respiratory infection, unspecified: Secondary | ICD-10-CM | POA: Diagnosis not present

## 2016-02-15 MED ORDER — AMOXICILLIN-POT CLAVULANATE 875-125 MG PO TABS: 1.0000 | ORAL_TABLET | Freq: Two times a day (BID) | ORAL | 0 refills | Status: DC

## 2016-02-15 MED ORDER — LEVOCETIRIZINE DIHYDROCHLORIDE 5 MG PO TABS: 5.0000 mg | ORAL_TABLET | Freq: Every evening | ORAL | 3 refills | Status: AC

## 2016-02-15 MED ORDER — AZELASTINE HCL 0.05 % OP SOLN
1.0000 [drp] | Freq: Two times a day (BID) | OPHTHALMIC | 12 refills | Status: DC
Start: 2016-02-15 — End: 2016-09-10

## 2016-02-15 MED FILL — AMOX-CLAV 875-125 MG TABLET: 875-125 | 10 days supply | Qty: 20 | Fill #0

## 2016-02-15 MED FILL — AZELASTINE HCL 0.05% DROPS: 0.05 | 30 days supply | Qty: 6 | Fill #0

## 2016-02-15 NOTE — Patient Instructions (Addendum)
I have referred you to ENT.  Start Augmentin 875-125 mg 1 tablet every twice daily.  Optivar eye drops, apply 1 drop per eye twice daily and itching and redness resolves.  Discontinue Claritin. Start Levocetirizine (Xyzal) 5 mg once daily.   Return for follow-up if symptoms do not improve.   IF you received an x-ray today, you will receive an invoice from [facility]. Please contact [facility] at [phone] with questions or concerns regarding your invoice.   IF you received labwork today, you will receive an invoice from [facility]. Please contact [facility] at [phone] with questions or concerns regarding your invoice.   Our billing staff will not be able to assist you with questions regarding bills from these companies.  You will be contacted with the lab results as soon as they are available. The fastest way to get your results is to activate your My Chart account. Instructions are located on the last page of this paperwork. If you have not heard from us regarding the results in 2 weeks, please contact this office.

## 2016-02-15 NOTE — Progress Notes (Signed)
Patient ID: Keith Trevino, male    DOB: Dec 24, 1965, 50 y.o.   MRN: [id number]  PCP: Georgann HousekeeperHUSAIN,KARRAR, MD  Chief Complaint  Patient presents with  . Conjunctivitis    x 1 week    Subjective:   HPI 50 year old male presents for evaluation of itchy red eyes times 1 week. [facility] and is employed by [employer]. He reports over the last week bilateral eyes have become increasing itchy and red. Denies any crusting upon awaken or purulent eye drainage. Reports regular visual acuity. He has hx of seasonal allergies and take Claritin and Flonase daily for symptom suppression. In addition he reports persistent nasal drainage over the last few weeks with bilateral ear pain, pressure, and mucus like ear discharge present over the last 2 weeks. He has hx chronic headaches which responds to otc analgesics. Denies fever or N&V. He requests a referral to ENT with Dr. Haroldine Lawsrossley.  Social History   Social History  . Marital status: Married    Spouse name: N/A  . Number of children: N/A  . Years of education: N/A   Occupational History  . Not on file.   Social History Main Topics  . Smoking status: Never Smoker  . Smokeless tobacco: Never Used  . Alcohol use Not on file  . Drug use: Unknown  . Sexual activity: Not on file   Other Topics Concern  . Not on file   Social History Narrative  . No narrative on file    Family History  Problem Relation Age of Onset  . Diabetes Other   . Hypertension Other   . Stroke Other   . Heart attack Other   . Diabetes Mother   . Kidney disease Mother   . Cancer Father      Review of Systems See HPI    There are no active problems to display for this patient.    Prior to Admission medications   Medication Sig Start Date End Date Taking? Authorizing Provider  Albiglutide (TANZEUM) 30 MG PEN Inject 30 mg into the skin.   Yes Historical Provider, MD  aspirin 81 MG tablet Take 81 mg by mouth daily.   Yes Historical  Provider, MD  canagliflozin (INVOKANA) 100 MG TABS tablet Take 300 mg by mouth daily before breakfast.    Yes Historical Provider, MD  fluticasone (FLONASE) 50 MCG/ACT nasal spray Place 1 spray into both nostrils daily.   Yes Historical Provider, MD  GLIMEPIRIDE PO Take 4 mg by mouth 2 (two) times daily.    Yes Historical Provider, MD  ibuprofen (ADVIL,MOTRIN) 200 MG tablet Take 200 mg by mouth every 6 (six) hours as needed (two tablets). Two tablets   Yes Historical Provider, MD  metFORMIN (GLUCOPHAGE) 500 MG tablet Take 500 mg by mouth 2 (two) times daily with a meal.   Yes Historical Provider, MD  Multiple Vitamin (MULTIVITAMIN) tablet Take 1 tablet by mouth daily.   Yes Historical Provider, MD  RAMIPRIL PO Take 5 mg by mouth daily.    Yes Historical Provider, MD  simvastatin (ZOCOR) 10 MG tablet Take 10 mg by mouth daily.   Yes Historical Provider, MD  sitaGLIPtin (JANUVIA) 100 MG tablet Take 100 mg by mouth daily.   Yes Historical Provider, MD  Dulaglutide (TRULICITY) 0.75 MG/0.5ML SOPN Inject 0.75 mg into the skin once a week. Reported on 06/19/2015 11/13/14   Historical Provider, MD  Exenatide ER (BYDUREON) 2 MG PEN Inject 2  mg into the skin once a week. Reported on 06/19/2015    Historical Provider, MD  pioglitazone (ACTOS) 15 MG tablet Take 15 mg by mouth daily. Reported on 06/19/2015    Historical Provider, MD  sitaGLIPtin-metformin (JANUMET) 50-500 MG per tablet Take 1 tablet by mouth 2 (two) times daily with a meal. Reported on 06/19/2015    Historical Provider, MD     Allergies  Allergen Reactions  . Actos [Pioglitazone] Other (See Comments)    Weight gain  . Bydureon [Exenatide] Other (See Comments)    Caused abdominal pain  . Trulicity [Dulaglutide]     Caused abdominal pain  . Victoza [Liraglutide] Rash     Objective:  Physical Exam  Constitutional: He is oriented to person, place, and time. He appears well-developed and well-nourished.  HENT:  Head: Normocephalic and atraumatic.   Right Ear: Hearing normal. There is drainage and tenderness. Tympanic membrane is bulging.  Left Ear: Hearing normal. There is drainage and tenderness. Tympanic membrane is bulging.  Nose: Mucosal edema and rhinorrhea present.  Mouth/Throat: Oropharynx is clear and moist.  Eyes: Pupils are equal, round, and reactive to light.  Erythematous sclera and conjunctiva. Watery clear eye drainage bilaterally. Lids erythematous   Neck: Normal range of motion.  Cardiovascular: Normal rate, regular rhythm, normal heart sounds and intact distal pulses.   Pulmonary/Chest: Effort normal and breath sounds normal.  Musculoskeletal: Normal range of motion.  Lymphadenopathy:    He has no cervical adenopathy.  Neurological: He is alert and oriented to person, place, and time.  Skin: Skin is warm and dry.  Psychiatric: He has a normal mood and affect. His behavior is normal. Judgment and thought content normal.   Vitals:   02/15/16 0935  BP: 127/84  Pulse: 76  Resp: 16  Temp: 98.1 F (36.7 C)   Assessment & Plan:  1. Acute suppurative otitis media of both ears without spontaneous rupture of tympanic membranes, recurrence not specified 2. Viral eye infection, bilateral 3. Acute upper respiratory infection  Plan: Ambulatory referral to ENT  Start Augmentin 875-125 mg 1 tablet every twice daily.  Azelastine 0.05% Optivar eye drops, apply 1 drop per eye twice daily and itching and redness resolves.  Discontinue Claritin. Start Levocetirizine (Xyzal) 5 mg once daily.   Return for follow-up if symptoms do not improve.  Godfrey PickKimberly S. Tiburcio PeaHarris, MSN, FNP-C [facility]

## 2016-02-20 DIAGNOSIS — J370 Chronic laryngitis: Secondary | ICD-10-CM | POA: Diagnosis not present

## 2016-02-20 DIAGNOSIS — H6523 Chronic serous otitis media, bilateral: Secondary | ICD-10-CM | POA: Diagnosis not present

## 2016-02-20 DIAGNOSIS — H6693 Otitis media, unspecified, bilateral: Secondary | ICD-10-CM | POA: Diagnosis not present

## 2016-02-20 DIAGNOSIS — H903 Sensorineural hearing loss, bilateral: Secondary | ICD-10-CM | POA: Diagnosis not present

## 2016-02-20 DIAGNOSIS — J320 Chronic maxillary sinusitis: Secondary | ICD-10-CM | POA: Diagnosis not present

## 2016-02-20 DIAGNOSIS — J342 Deviated nasal septum: Secondary | ICD-10-CM | POA: Diagnosis not present

## 2016-02-20 DIAGNOSIS — J322 Chronic ethmoidal sinusitis: Secondary | ICD-10-CM | POA: Diagnosis not present

## 2016-02-20 MED FILL — DOXYCYCLINE HYCLATE 100 MG: 100 | 10 days supply | Qty: 20 | Fill #0

## 2016-02-27 DIAGNOSIS — H6693 Otitis media, unspecified, bilateral: Secondary | ICD-10-CM | POA: Diagnosis not present

## 2016-02-27 DIAGNOSIS — H902 Conductive hearing loss, unspecified: Secondary | ICD-10-CM | POA: Diagnosis not present

## 2016-02-27 DIAGNOSIS — H6063 Unspecified chronic otitis externa, bilateral: Secondary | ICD-10-CM | POA: Diagnosis not present

## 2016-02-27 DIAGNOSIS — H6122 Impacted cerumen, left ear: Secondary | ICD-10-CM | POA: Diagnosis not present

## 2016-02-27 MED FILL — CIPRODEX OTIC SUSPENSION: 0.3-0.1 | 10 days supply | Qty: 8 | Fill #0

## 2016-02-27 MED FILL — CIPROFLOXACIN HCL 500 MG TA: 500 | 10 days supply | Qty: 20 | Fill #0

## 2016-03-03 MED FILL — TANZEUM 50 MG PEN INJECT: 50 | 28 days supply | Qty: 4 | Fill #1

## 2016-03-11 DIAGNOSIS — H6122 Impacted cerumen, left ear: Secondary | ICD-10-CM | POA: Diagnosis not present

## 2016-03-11 DIAGNOSIS — H6063 Unspecified chronic otitis externa, bilateral: Secondary | ICD-10-CM | POA: Diagnosis not present

## 2016-03-11 MED FILL — CIPRODEX OTIC SUSPENSION: 0.3-0.1 | 10 days supply | Qty: 8 | Fill #0

## 2016-03-20 ENCOUNTER — Encounter: Payer: Self-pay | Admitting: Internal Medicine

## 2016-03-20 ENCOUNTER — Other Ambulatory Visit: Payer: Self-pay | Admitting: *Deleted

## 2016-03-20 ENCOUNTER — Ambulatory Visit (INDEPENDENT_AMBULATORY_CARE_PROVIDER_SITE_OTHER): Payer: 59 | Admitting: Internal Medicine

## 2016-03-20 VITALS — BP 116/76 | HR 70 | Ht 68.5 in | Wt 200.0 lb

## 2016-03-20 DIAGNOSIS — E1165 Type 2 diabetes mellitus with hyperglycemia: Secondary | ICD-10-CM

## 2016-03-20 DIAGNOSIS — H6063 Unspecified chronic otitis externa, bilateral: Secondary | ICD-10-CM | POA: Diagnosis not present

## 2016-03-20 DIAGNOSIS — H6121 Impacted cerumen, right ear: Secondary | ICD-10-CM | POA: Diagnosis not present

## 2016-03-20 DIAGNOSIS — H6523 Chronic serous otitis media, bilateral: Secondary | ICD-10-CM | POA: Diagnosis not present

## 2016-03-20 LAB — POCT GLYCOSYLATED HEMOGLOBIN (HGB A1C): HEMOGLOBIN A1C: 7.7

## 2016-03-20 MED ORDER — METFORMIN HCL ER 500 MG PO TB24: ORAL_TABLET | ORAL | 3 refills | Status: DC

## 2016-03-20 MED FILL — METFORMIN HCL ER 500 MG TAB: 500 | 90 days supply | Qty: 360 | Fill #0

## 2016-03-20 NOTE — Progress Notes (Signed)
Patient ID: Keith Trevino, male   DOB: 09-20-1965, 50 y.o.   MRN: [id number]   HPI: Keith Pelimothy J Kunesh is a 50 y.o.-year-old male, referred by his PCP,HUSAIN,KARRAR, MD, for management of DM2, dx initially prediabetes in 2005, non-insulin-dependent, uncontrolled, without long term complications. He previously saw Dr. Leslie DalesAltheimer, last visit 01/10/2016.  Last hemoglobin A1c was: 01/10/2016: HbA1c 7.6% Fructosamine 355 (calculated HbA1c 7.6%) 09/2015: HbA1c 7.4% 06/2015: HbA1c 8.5% 03/2015: HbA1c 9.9% 01/2015: HbA1c 9.8% Lab Results  Component Value Date   HGBA1C 9.0 11/06/2014  01/2015: C-peptide 2.9, GAD antibody negative - per review of Dr. Berline LopesAltheimer's notes  Pt is on a regimen of: - Metformin 500 mg 2x a day, with meals - he had diarrhea with higher doses.  - Januvia 100 mg daily - Glimepiride 4 mg 2x a day - Invokana 300 mg in am - started 04/2015 - Tanzeum 50 mg weekly He could not tolerate  Byetta, Victoza, Trulicity, and Bydureon 2/2 AP. He was on Actos.  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  - no CKD, last BUN/creatinine:  01/07/2016: 20/0.94, glucose 190, AST/ALT 16/23 No results found for: BUN, CREATININE  On Ramipril. - last set of lipids: 01/06/2006: 162/130/49/87 No results found for: CHOL, HDL, LDLCALC, LDLDIRECT, TRIG, CHOLHDL  On Zocor. - last eye exam was in 02/2015. [facility]. - no numbness and tingling in his feet. Last foot exam 12/2015.  Pt has FH of DM in mother.  ROS: Constitutional: no weight gain/loss, + fatigue, no subjective hyperthermia/hypothermia Eyes: no blurry vision, no xerophthalmia ENT: no sore throat, no nodules palpated in throat, no dysphagia/odynophagia, no hoarseness, + tinnitus Cardiovascular: no CP/SOB/palpitations/leg swelling Respiratory: no cough/SOB Gastrointestinal: no N/V/D/C/+ heartburn Musculoskeletal: no muscle/joint aches Skin: + rash, + Itching Neurological: no tremors/numbness/tingling/dizziness Psychiatric: no depression/anxiety  Past Medical History:  Diagnosis Date  . Diabetes mellitus without complication (HCC)   . Hypertension    No past surgical history on file. Social History   Social History  . Marital status: Married    Spouse name: N/A  . Number of children: 2   Occupational History  . [employer]    Social History Main Topics  . Smoking status: Never Smoker  . Smokeless tobacco: Never Used  . Alcohol use Not on file  . Drug use: Unknown   Current Outpatient Prescriptions on File Prior to Visit  Medication Sig Dispense Refill  . amoxicillin-clavulanate (AUGMENTIN) 875-125 MG tablet Take 1 tablet by mouth 2 (two) times daily. 20 tablet 0  . aspirin 81 MG tablet Take 81 mg by mouth daily.    Marland Kitchen. azelastine (OPTIVAR) 0.05 % ophthalmic solution Place 1 drop into both eyes 2 (two) times daily. Until symptoms resolve 6 mL 12  . fluticasone (FLONASE) 50 MCG/ACT nasal spray Place 1 spray into both nostrils daily.    .Marland Kitchen  GLIMEPIRIDE PO Take 4 mg by mouth 2 (two) times daily.     Marland Kitchen. ibuprofen (ADVIL,MOTRIN) 200 MG tablet Take 200 mg by mouth every 6 (six) hours as needed (two tablets). Two tablets    . levocetirizine (XYZAL) 5 MG tablet Take 1 tablet (5 mg total) by mouth every evening. 90 tablet 3  . metFORMIN (GLUCOPHAGE) 500 MG tablet Take 500 mg by mouth 2 (two) times daily  with a meal.    . Multiple Vitamin (MULTIVITAMIN) tablet Take 1 tablet by mouth daily.    Marland Kitchen. RAMIPRIL PO Take 5 mg by mouth daily.     . simvastatin (ZOCOR) 10 MG tablet Take 10 mg by mouth daily.     No current facility-administered medications on file prior to visit.    Other medications: - Januvia 100 mg daily in a.m. - Tanzeum 50 mg weekly - INVOKANA 300 mg daily in a.m.  Allergies  Allergen Reactions  . Actos [Pioglitazone] Other (See Comments)    Weight gain  . Bydureon [Exenatide] Other (See Comments)    Caused abdominal pain  . Trulicity [Dulaglutide]     Caused abdominal pain  . Victoza [Liraglutide] Rash   Family History  Problem Relation Age of Onset  . Diabetes Other   . Hypertension Other   . Stroke Other   . Heart attack Other   . Diabetes Mother   . Kidney disease Mother   . Cancer Father    PE: BP 116/76   Pulse 70   Ht 5' 8.5" (1.74 m)   Wt 200 lb (90.7 kg)   BMI 29.97 kg/m   Wt Readings from Last 3 Encounters:  03/20/16 200 lb (90.7 kg)  02/15/16 202 lb 3.2 oz (91.7 kg)  11/23/15 197 lb 12.8 oz (89.7 kg)   Constitutional: overweight, in NAD Eyes: PERRLA, EOMI, no exophthalmos ENT: moist mucous membranes, no thyromegaly, no cervical lymphadenopathy Cardiovascular: RRR, No MRG Respiratory: CTA B Gastrointestinal: abdomen soft, NT, ND, BS+ Musculoskeletal: no deformities, strength intact in all 4 Skin: moist, warm, no rashes Neurological: no tremor with outstretched hands, DTR normal in all 4  ASSESSMENT: 1. DM2, non-insulin-dependent, uncontrolled, without long term complications, but with hyperglycemia  PLAN:  1. Patient with long-standing, uncontrolled diabetes, on oral antidiabetic regimen+ GLP1 R agonist, which became insufficient. HbA1c today: 7.7%. - Patient is not checking sugars at home, and I strongly advised him to start checking once a day. We may be able to switch to a CGM (freestyle [employer]Libre) in the near future. He agrees with  this. - He admits for snacking at night, and we discussed about switching healthier snacks. Given suggestions. - We also discussed that we do not have many options for changing his regimen right now, but I advised him to stop Januvia, since it is redundant in the setting of Tanzeum use. We also discussed the Tanzeum will be discontinued in June of next year. - I strongly suggested that he started to improve his diet so that we do not need to escalate his treatment towards basal insulin. - For now, will switch from regular metformin to the extended-release to hopefully be able to increase his dose. - I suggested to:  Patient Instructions  Please continue: - Glimepiride 4 mg 2x a day - move this before meals - Invokana 300 mg in am - Tanzeum 50 mg weekly  Change Metformin to the extended release formulation and try to increase the dose up to 2000 mg daily.  Stop Januvia.  Please return in 1.5 months with your sugar log.   - given sugar log and advised how to fill it and to bring it at next appt  - given foot care handout and explained the principles  - given instructions for hypoglycemia management "15-15 rule"  - advised for yearly eye exams  - Return to clinic in 1.5 mo with sugar log   Carlus Pavlov, MD PhD [facility] Endocrinology

## 2016-03-20 NOTE — Patient Instructions (Addendum)
Please continue: - Glimepiride 4 mg 2x a day - move this before meals - Invokana 300 mg in am - Tanzeum 50 mg weekly  Change Metformin to the extended release formulation and try to increase the dose up to 2000 mg daily.  Stop Januvia.  Please return in 1.5 months with your sugar log.   PATIENT INSTRUCTIONS FOR TYPE 2 DIABETES:  **Please join MyChart!** - see attached instructions about how to join if you have not done so already.  DIET AND EXERCISE Diet and exercise is an important part of diabetic treatment.  We recommended aerobic exercise in the form of brisk walking (working between 40-60% of maximal aerobic capacity, similar to brisk walking) for 150 minutes per week (such as 30 minutes five days per week) along with 3 times per week performing 'resistance' training (using various gauge rubber tubes with handles) 5-10 exercises involving the major muscle groups (upper body, lower body and core) performing 10-15 repetitions (or near fatigue) each exercise. Start at half the above goal but build slowly to reach the above goals. If limited by weight, joint pain, or disability, we recommend daily walking in a swimming pool with water up to waist to reduce pressure from joints while allow for adequate exercise.    BLOOD GLUCOSES Monitoring your blood glucoses is important for continued management of your diabetes. Please check your blood glucoses 2-4 times a day: fasting, before meals and at bedtime (you can rotate these measurements - e.g. one day check before the 3 meals, the next day check before 2 of the meals and before bedtime, etc.).   HYPOGLYCEMIA (low blood sugar) Hypoglycemia is usually a reaction to not eating, exercising, or taking too much insulin/ other diabetes drugs.  Symptoms include tremors, sweating, hunger, confusion, headache, etc. Treat IMMEDIATELY with 15 grams of Carbs: . 4 glucose tablets .  cup regular juice/soda . 2 tablespoons raisins . 4 teaspoons  sugar . 1 tablespoon honey Recheck blood glucose in 15 mins and repeat above if still symptomatic/blood glucose <100.  RECOMMENDATIONS TO REDUCE YOUR RISK OF DIABETIC COMPLICATIONS: * Take your prescribed MEDICATION(S) * Follow a DIABETIC diet: Complex carbs, fiber rich foods, (monounsaturated and polyunsaturated) fats * AVOID saturated/trans fats, high fat foods, >2,300 mg salt per day. * EXERCISE at least 5 times a week for 30 minutes or preferably daily.  * DO NOT SMOKE OR DRINK more than 1 drink a day. * Check your FEET every day. Do not wear tightfitting shoes. Contact us if you develop an ulcer * See your EYE doctor once a year or more if needed * Get a FLU shot once a year * Get a PNEUMONIA vaccine once before and once after age 51 years  GOALS:  * Your Hemoglobin A1c of <7%  * fasting sugars need to be <130 * after meals sugars need to be <180 (2h after you start eating) * Your Systolic BP should be 140 or lower  * Your Diastolic BP should be 80 or lower  * Your HDL (Good Cholesterol) should be 40 or higher  * Your LDL (Bad Cholesterol) should be 100 or lower. * Your Triglycerides should be 150 or lower  * Your Urine microalbumin (kidney function) should be <30 * Your Body Mass Index should be 25 or lower    Please consider the following ways to cut down carbs and fat and increase fiber and micronutrients in your diet: - substitute whole grain for white bread or pasta - substitute brown  rice for white rice - substitute 90-calorie flat bread pieces for slices of bread when possible - substitute sweet potatoes or yams for white potatoes - substitute humus for margarine - substitute tofu for cheese when possible - substitute almond or rice milk for regular milk (would not drink soy milk daily due to concern for soy estrogen influence on breast cancer risk) - substitute dark chocolate for other sweets when possible - substitute water - can add lemon or orange slices for taste  - for diet sodas (artificial sweeteners will trick your body that you can eat sweets without getting calories and will lead you to overeating and weight gain in the long run) - do not skip breakfast or other meals (this will slow down the metabolism and will result in more weight gain over time)  - can try smoothies made from fruit and almond/rice milk in am instead of regular breakfast - can also try old-fashioned (not instant) oatmeal made with almond/rice milk in am - order the dressing on the side when eating salad at a restaurant (pour less than half of the dressing on the salad) - eat as little meat as possible - can try juicing, but should not forget that juicing will get rid of the fiber, so would alternate with eating raw veg./fruits or drinking smoothies - use as little oil as possible, even when using olive oil - can dress a salad with a mix of balsamic vinegar and lemon juice, for e.g. - use agave nectar, stevia sugar, or regular sugar rather than artificial sweateners - steam or broil/roast veggies  - snack on veggies/fruit/nuts (unsalted, preferably) when possible, rather than processed foods - reduce or eliminate aspartame in diet (it is in diet sodas, chewing gum, etc) Read the labels!  Try to read Dr. Katherina RightNeal Barnard's book: "Program for Reversing Diabetes" for other ideas for healthy eating.

## 2016-03-25 MED FILL — RAMIPRIL 5 MG CAPSULE: 5 | 90 days supply | Qty: 90 | Fill #0

## 2016-03-25 MED FILL — AZITHROMYCIN 250 MG TABLET: 250 | 5 days supply | Qty: 6 | Fill #0

## 2016-03-26 DIAGNOSIS — H6063 Unspecified chronic otitis externa, bilateral: Secondary | ICD-10-CM | POA: Diagnosis not present

## 2016-03-26 DIAGNOSIS — H6121 Impacted cerumen, right ear: Secondary | ICD-10-CM | POA: Diagnosis not present

## 2016-04-03 MED FILL — AMOX-CLAV 500-125 MG TABLET: 500-125 | 10 days supply | Qty: 20 | Fill #0

## 2016-04-04 DIAGNOSIS — H6063 Unspecified chronic otitis externa, bilateral: Secondary | ICD-10-CM | POA: Diagnosis not present

## 2016-04-04 DIAGNOSIS — H6122 Impacted cerumen, left ear: Secondary | ICD-10-CM | POA: Diagnosis not present

## 2016-04-09 ENCOUNTER — Other Ambulatory Visit: Payer: Self-pay | Admitting: Emergency Medicine

## 2016-04-15 ENCOUNTER — Ambulatory Visit: Payer: Self-pay | Admitting: Internal Medicine

## 2016-04-18 MED FILL — INVOKANA 300 MG TABLET: 300 | 90 days supply | Qty: 90 | Fill #4

## 2016-04-18 MED FILL — GLIMEPIRIDE 4 MG TABLET: 4 | 30 days supply | Qty: 60 | Fill #2

## 2016-04-29 DIAGNOSIS — H6122 Impacted cerumen, left ear: Secondary | ICD-10-CM | POA: Diagnosis not present

## 2016-04-29 DIAGNOSIS — H6063 Unspecified chronic otitis externa, bilateral: Secondary | ICD-10-CM | POA: Diagnosis not present

## 2016-05-05 MED FILL — SIMVASTATIN 10 MG TABLET: 10 | 90 days supply | Qty: 90 | Fill #0

## 2016-05-12 ENCOUNTER — Ambulatory Visit (INDEPENDENT_AMBULATORY_CARE_PROVIDER_SITE_OTHER): Payer: 59 | Admitting: Internal Medicine

## 2016-05-12 ENCOUNTER — Encounter: Payer: Self-pay | Admitting: Internal Medicine

## 2016-05-12 VITALS — BP 128/88 | HR 90 | Ht 69.0 in | Wt 198.0 lb

## 2016-05-12 DIAGNOSIS — E1165 Type 2 diabetes mellitus with hyperglycemia: Secondary | ICD-10-CM | POA: Diagnosis not present

## 2016-05-12 NOTE — Patient Instructions (Addendum)
Please continue: - Metformin ER 2000 mg daily with dinner - Glimepiride 4 mg 2x a day before meals - Invokana 300 mg in am  Restart Januvia 100 mg in am.  Please take 1.5 tabs of Glimepiride before dinner for a larger meal or if you have dessert.  Please return in 3 months with your sugar log.

## 2016-05-12 NOTE — Progress Notes (Signed)
Patient ID: Keith Trevino, male   DOB: February 12, 1966, 51 y.o.   MRN: [id number]   HPI: Keith Pelimothy J Merlos is a 51 y.o.-year-old male, initially referred by his PCP,HUSAIN,KARRAR, MD, returning for follow-up for DM2, dx initially prediabetes in 2005, non-insulin-dependent, uncontrolled, without long term complications. He previously saw Dr. Leslie DalesAltheimer, last visit with him 01/10/2016. Last visit with me 03/2016.   He just started the [employer] at work.   He started to cut down meat and fat.  Reviewed previous hemoglobin A1c levels: Lab Results  Component Value Date   HGBA1C 7.7 03/20/2016   HGBA1C 9.0 11/06/2014  01/10/2016: HbA1c 7.6% Fructosamine 355 (calculated HbA1c 7.6%) 09/2015: HbA1c 7.4% 06/2015: HbA1c 8.5% 03/2015: HbA1c 9.9% 01/2015: HbA1c 9.8%, C-peptide 2.9, GAD antibody negative - per review of Dr. Berline LopesAltheimer's notes  Pt was on: - Metformin 500 mg 2x a day, with meals - he had diarrhea with higher doses.  - Januvia 100 mg daily - Glimepiride 4 mg 2x a day - Invokana 300 mg in am - started 04/2015 - Tanzeum 50 mg weekly He could not tolerate  Byetta, Victoza, Trulicity, and Bydureon 2/2 AP. He was on Actos.  At last visit, we switched to: - Metformin ER 2000 mg daily with dinner - Glimepiride 4 mg 2x a day before meals - Invokana 300 mg in am He stopped Tanzeum 50 mg weekly >> he ran out  Pt checks his sugars 0-1 a day and they are: - am: 120-150 >> 129-147 - 2h after b'fast: n/c - before lunch: n/c - 2h after lunch: n/c - before dinner: n/c >> 137 - 2h after dinner: n/c >> 148 - bedtime: n/c >> 159-218 - nighttime: n/c  He wore a CGM [employer] Libre in 03/2015: No nocturnal hypoglycemia tendency, highest values and highest variability after supper  Glucometer: Bayer Contour next  Pt's meals are: - Breakfast: eggs + bacon OR grits OR oatmeal; coffee + milk - Lunch: meat + veggies - Dinner: meat + veggies - Snacks: PB, sugar-free cookies - after dinner He  se[facility] diabetes educator and diet[facility] 3 months.  - no CKD, last BUN/creatinine:  01/07/2016: 20/0.94, glucose 190, AST/ALT 16/23 No results found for: BUN, CREATININE  On Ramipril. - last set of lipids: 01/06/2006: 162/130/49/87 No results found for: CHOL, HDL, LDLCALC, LDLDIRECT, TRIG, CHOLHDL  On Zocor. - last eye exam was in 02/2016. [facility]. - no numbness and tingling in his feet. Last foot exam 12/2015.  ROS: Constitutional: no weight gain/loss, no fatigue, no subjective hyperthermia/hypothermia Eyes: no blurry vision, no xerophthalmia ENT: no sore throat, no nodules palpated in throat, no dysphagia/odynophagia, no hoarseness, + tinnitus, + hearing loss Cardiovascular: no CP/SOB/palpitations/leg swelling Respiratory: no cough/SOB Gastrointestinal: no N/V/D/C/heartburn Musculoskeletal: no muscle/joint aches Skin: no rash Neurological: no tremors/numbness/tingling/dizziness  I reviewed pt's medications, allergies, PMH, social hx, family hx, and changes were documented in the history of present illness. Otherwise, unchanged from my initial visit note.  Past Medical History:  Diagnosis Date  . Diabetes mellitus without complication (HCC)   . Hypertension    No past surgical history on file. Social History   Social History  . Marital status: Married    Spouse name: N/A  . Number of children: 2   Occupational History  . [employer]    Social History Main Topics  . Smoking status: Never Smoker  . Smokeless tobacco: Never Used  . Alcohol use Not on file  .  Drug use: Unknown   Current Outpatient Prescriptions on File Prior to Visit  Medication Sig Dispense Refill  . aspirin 81 MG tablet Take 81 mg by mouth daily.    Marland Kitchen azelastine (OPTIVAR) 0.05 % ophthalmic solution Place 1 drop into both eyes 2 (two) times daily. Until symptoms resolve 6 mL 12  . canagliflozin (INVOKANA) 300 MG TABS tablet Take 300 mg by mouth daily  before breakfast.    . fluticasone (FLONASE) 50 MCG/ACT nasal spray Place 1 spray into both nostrils daily.    Marland Kitchen GLIMEPIRIDE PO Take 4 mg by mouth 2 (two) times daily.     Marland Kitchen ibuprofen (ADVIL,MOTRIN) 200 MG tablet Take 200 mg by mouth every 6 (six) hours as needed (two tablets). Two tablets    . levocetirizine (XYZAL) 5 MG tablet Take 1 tablet (5 mg total) by mouth every evening. 90 tablet 3  . metFORMIN (GLUCOPHAGE-XR) 500 MG 24 hr tablet Date by mouth total of 2000 mg daily 360 tablet 3  . Multiple Vitamin (MULTIVITAMIN) tablet Take 1 tablet by mouth daily.    Marland Kitchen RAMIPRIL PO Take 5 mg by mouth daily.     . simvastatin (ZOCOR) 10 MG tablet Take 10 mg by mouth daily.    . Albiglutide 50 MG PEN Inject 50 mg into the skin.     No current facility-administered medications on file prior to visit.    Other medications: - Januvia 100 mg daily in a.m. - Tanzeum 50 mg weekly - INVOKANA 300 mg daily in a.m.  Allergies  Allergen Reactions  . Actos [Pioglitazone] Other (See Comments)    Weight gain  . Bydureon [Exenatide] Other (See Comments)    Caused abdominal pain  . Trulicity [Dulaglutide]     Caused abdominal pain  . Victoza [Liraglutide] Rash   Family History  Problem Relation Age of Onset  . Diabetes Other   . Hypertension Other   . Stroke Other   . Heart attack Other   . Diabetes Mother   . Kidney disease Mother   . Cancer Father    PE: BP 128/88 (BP Location: Left Arm, Patient Position: Sitting)   Pulse 90   Ht 5\' 9"  (1.753 m)   Wt 198 lb (89.8 kg)   SpO2 97%   BMI 29.24 kg/m  Wt Readings from Last 3 Encounters:  05/12/16 198 lb (89.8 kg)  03/20/16 200 lb (90.7 kg)  02/15/16 202 lb 3.2 oz (91.7 kg)   Constitutional: overweight, in NAD Eyes: PERRLA, EOMI, no exophthalmos ENT: moist mucous membranes, no thyromegaly, no cervical lymphadenopathy Cardiovascular: RRR, No MRG Respiratory: CTA B Gastrointestinal: abdomen soft, NT, ND, BS+ Musculoskeletal: no deformities,  strength intact in all 4 Skin: moist, warm, no rashes Neurological: no tremor with outstretched hands, DTR normal in all 4  ASSESSMENT: 1. DM2, non-insulin-dependent, uncontrolled, without long term complications, but with hyperglycemia  PLAN:  1. Patient with long-standing, uncontrolled diabetes, on oral antidiabetic regimen + prev. GLP1 R agonist, which he stopped when he ran out 1.5 mo ago. Sugars are high after dinner/bedtime.  - HbA1c at last visit: 7.7%. - He is working on his diet >> cut down meat. - will advise him to increase Amaryl before dinner, and add back Januvia  - I suggested to:  Patient Instructions  Please continue: - Metformin ER 2000 mg daily with dinner - Glimepiride 4 mg 2x a day before meals - Invokana 300 mg in am  Restart Januvia 100 mg in am.  Please  take 1.5 tabs of Glimepiride before dinner for a larger meal or if you have dessert.  Please return in 3 months with your sugar log.   - continue to check sugars 1-2x a day - advised for yearly eye exams >> he is UTD >> will call ophthalmologist to have him send me the report - Return to clinic in 3 mo with sugar log   Carlus Pavlov, MD PhD [facility]

## 2016-05-15 MED FILL — GLIMEPIRIDE 4 MG TABLET: 4 | 30 days supply | Qty: 60 | Fill #0

## 2016-05-29 DIAGNOSIS — N200 Calculus of kidney: Secondary | ICD-10-CM | POA: Diagnosis not present

## 2016-05-29 DIAGNOSIS — J309 Allergic rhinitis, unspecified: Secondary | ICD-10-CM | POA: Diagnosis not present

## 2016-05-29 DIAGNOSIS — H1010 Acute atopic conjunctivitis, unspecified eye: Secondary | ICD-10-CM | POA: Diagnosis not present

## 2016-05-29 DIAGNOSIS — E1165 Type 2 diabetes mellitus with hyperglycemia: Secondary | ICD-10-CM | POA: Diagnosis not present

## 2016-05-29 DIAGNOSIS — Z23 Encounter for immunization: Secondary | ICD-10-CM | POA: Diagnosis not present

## 2016-05-29 DIAGNOSIS — E782 Mixed hyperlipidemia: Secondary | ICD-10-CM | POA: Diagnosis not present

## 2016-05-29 DIAGNOSIS — R21 Rash and other nonspecific skin eruption: Secondary | ICD-10-CM | POA: Diagnosis not present

## 2016-05-29 DIAGNOSIS — I10 Essential (primary) hypertension: Secondary | ICD-10-CM | POA: Diagnosis not present

## 2016-05-29 DIAGNOSIS — Z1389 Encounter for screening for other disorder: Secondary | ICD-10-CM | POA: Diagnosis not present

## 2016-05-29 DIAGNOSIS — Z0000 Encounter for general adult medical examination without abnormal findings: Secondary | ICD-10-CM | POA: Diagnosis not present

## 2016-05-29 MED FILL — OLOPATADINE HCL 0.1% EYE DR: 0.1 | 25 days supply | Qty: 5 | Fill #0

## 2016-05-29 MED FILL — CLOBETASOL 0.05% TOPICAL LO: 0.05 | 14 days supply | Qty: 59 | Fill #0

## 2016-06-05 MED FILL — METFORMIN HCL ER 500 MG TAB: 500 | 90 days supply | Qty: 360 | Fill #1

## 2016-06-05 MED FILL — RAMIPRIL 5 MG CAPSULE: 5 | 90 days supply | Qty: 90 | Fill #1

## 2016-06-16 DIAGNOSIS — B369 Superficial mycosis, unspecified: Secondary | ICD-10-CM | POA: Diagnosis not present

## 2016-06-16 DIAGNOSIS — H906 Mixed conductive and sensorineural hearing loss, bilateral: Secondary | ICD-10-CM | POA: Diagnosis not present

## 2016-06-16 DIAGNOSIS — H6240 Otitis externa in other diseases classified elsewhere, unspecified ear: Secondary | ICD-10-CM | POA: Diagnosis not present

## 2016-06-16 DIAGNOSIS — H9313 Tinnitus, bilateral: Secondary | ICD-10-CM | POA: Diagnosis not present

## 2016-06-16 DIAGNOSIS — H903 Sensorineural hearing loss, bilateral: Secondary | ICD-10-CM | POA: Diagnosis not present

## 2016-06-20 MED FILL — JANUVIA 100 MG TABLET: 100 | 90 days supply | Qty: 90 | Fill #3

## 2016-06-20 MED FILL — GLIMEPIRIDE 4 MG TABLET: 4 | 30 days supply | Qty: 60 | Fill #1

## 2016-07-03 MED FILL — GAVILYTE-N SOLUTION: 420 | 1 days supply | Qty: 4000 | Fill #0

## 2016-07-04 ENCOUNTER — Other Ambulatory Visit: Payer: Self-pay | Admitting: Gastroenterology

## 2016-07-07 DIAGNOSIS — B369 Superficial mycosis, unspecified: Secondary | ICD-10-CM | POA: Diagnosis not present

## 2016-07-07 DIAGNOSIS — H6240 Otitis externa in other diseases classified elsewhere, unspecified ear: Secondary | ICD-10-CM | POA: Diagnosis not present

## 2016-07-07 DIAGNOSIS — H9313 Tinnitus, bilateral: Secondary | ICD-10-CM | POA: Diagnosis not present

## 2016-07-07 DIAGNOSIS — H903 Sensorineural hearing loss, bilateral: Secondary | ICD-10-CM | POA: Diagnosis not present

## 2016-07-23 MED FILL — GLIMEPIRIDE 4 MG TABLET: 4 | 30 days supply | Qty: 60 | Fill #2

## 2016-08-06 ENCOUNTER — Other Ambulatory Visit: Payer: Self-pay

## 2016-08-06 MED ORDER — CANAGLIFLOZIN 300 MG PO TABS: 300.0000 mg | ORAL_TABLET | Freq: Every day | ORAL | 2 refills | Status: DC

## 2016-08-06 MED FILL — INVOKANA 300 MG TABLET: 300 | 30 days supply | Qty: 30 | Fill #0

## 2016-08-08 ENCOUNTER — Ambulatory Visit: Payer: Self-pay | Admitting: Internal Medicine

## 2016-08-25 MED FILL — GLIMEPIRIDE 4 MG TABLET: 4 | 30 days supply | Qty: 60 | Fill #3

## 2016-08-30 MED FILL — INVOKANA 300 MG TABLET: 300 | 30 days supply | Qty: 30 | Fill #1

## 2016-09-01 MED FILL — SIMVASTATIN 10 MG TABLET: 10 | 90 days supply | Qty: 90 | Fill #1

## 2016-09-04 MED FILL — OLOPATADINE HCL 0.1 % SOLN: 0.1 | 25 days supply | Qty: 5 | Fill #1

## 2016-09-15 ENCOUNTER — Encounter (HOSPITAL_COMMUNITY)
Admission: RE | Disposition: A | Discharge status: Home / Self Care | Payer: Self-pay | Source: Ambulatory Visit | Attending: Gastroenterology

## 2016-09-15 ENCOUNTER — Ambulatory Visit (HOSPITAL_COMMUNITY): Payer: 59 | Admitting: Anesthesiology

## 2016-09-15 ENCOUNTER — Encounter (HOSPITAL_COMMUNITY): Payer: Self-pay | Admitting: Emergency Medicine

## 2016-09-15 ENCOUNTER — Ambulatory Visit (HOSPITAL_COMMUNITY)
Admission: RE | Admit: 2016-09-15 | Discharge: 2016-09-15 | Disposition: A | Discharge status: Home / Self Care | Payer: 59 | Source: Ambulatory Visit | Attending: Gastroenterology | Admitting: Gastroenterology

## 2016-09-15 DIAGNOSIS — Z7984 Long term (current) use of oral hypoglycemic drugs: Secondary | ICD-10-CM | POA: Diagnosis not present

## 2016-09-15 DIAGNOSIS — Z7982 Long term (current) use of aspirin: Secondary | ICD-10-CM | POA: Insufficient documentation

## 2016-09-15 DIAGNOSIS — Z79899 Other long term (current) drug therapy: Secondary | ICD-10-CM | POA: Insufficient documentation

## 2016-09-15 DIAGNOSIS — Z1211 Encounter for screening for malignant neoplasm of colon: Secondary | ICD-10-CM | POA: Insufficient documentation

## 2016-09-15 DIAGNOSIS — E119 Type 2 diabetes mellitus without complications: Secondary | ICD-10-CM | POA: Insufficient documentation

## 2016-09-15 DIAGNOSIS — I10 Essential (primary) hypertension: Secondary | ICD-10-CM | POA: Insufficient documentation

## 2016-09-15 DIAGNOSIS — Z87442 Personal history of urinary calculi: Secondary | ICD-10-CM | POA: Diagnosis not present

## 2016-09-15 DIAGNOSIS — Z888 Allergy status to other drugs, medicaments and biological substances status: Secondary | ICD-10-CM | POA: Diagnosis not present

## 2016-09-15 DIAGNOSIS — E11649 Type 2 diabetes mellitus with hypoglycemia without coma: Secondary | ICD-10-CM | POA: Diagnosis not present

## 2016-09-15 HISTORY — PX: COLONOSCOPY WITH PROPOFOL: SHX5780

## 2016-09-15 LAB — GLUCOSE, CAPILLARY: GLUCOSE-CAPILLARY: 114 mg/dL — AB (ref 65–99)

## 2016-09-15 SURGERY — COLONOSCOPY WITH PROPOFOL
Anesthesia: Monitor Anesthesia Care

## 2016-09-15 MED ORDER — PROPOFOL 10 MG/ML IV BOLUS
INTRAVENOUS | Status: DC | PRN
Start: 2016-09-15 — End: 2016-09-15
  Administered 2016-09-15 (×2): 40 mg via INTRAVENOUS

## 2016-09-15 MED ORDER — LIDOCAINE 2% (20 MG/ML) 5 ML SYRINGE
INTRAMUSCULAR | Status: AC
  Filled 2016-09-15: qty 5

## 2016-09-15 MED ORDER — ONDANSETRON HCL 4 MG/2ML IJ SOLN
INTRAMUSCULAR | Status: AC
  Filled 2016-09-15: qty 2

## 2016-09-15 MED ORDER — LIDOCAINE 2% (20 MG/ML) 5 ML SYRINGE
INTRAMUSCULAR | Status: DC | PRN
  Administered 2016-09-15: 80 mg via INTRAVENOUS

## 2016-09-15 MED ORDER — ONDANSETRON HCL 4 MG/2ML IJ SOLN
INTRAMUSCULAR | Status: DC | PRN
Start: 2016-09-15 — End: 2016-09-15
  Administered 2016-09-15: 4 mg via INTRAVENOUS

## 2016-09-15 MED ORDER — PROPOFOL 500 MG/50ML IV EMUL
INTRAVENOUS | Status: DC | PRN
  Administered 2016-09-15: 100 ug/kg/min via INTRAVENOUS

## 2016-09-15 MED ORDER — PROPOFOL 10 MG/ML IV BOLUS
INTRAVENOUS | Status: AC
  Filled 2016-09-15: qty 60

## 2016-09-15 MED ORDER — LACTATED RINGERS IV SOLN
INTRAVENOUS | Status: DC | PRN
  Administered 2016-09-15: 08:00:00 via INTRAVENOUS

## 2016-09-15 MED ORDER — SODIUM CHLORIDE 0.9 % IV SOLN: INTRAVENOUS | Status: DC

## 2016-09-15 SURGICAL SUPPLY — 21 items

## 2016-09-15 NOTE — Anesthesia Preprocedure Evaluation (Signed)
Anesthesia Evaluation    Airway Mallampati: II  TM Distance: >3 FB Neck ROM: Full    Dental no notable dental hx.    Pulmonary    Pulmonary exam normal breath sounds clear to auscultation       Cardiovascular hypertension, Pt. on medications Normal cardiovascular exam Rhythm:Regular Rate:Normal     Neuro/Psych    GI/Hepatic   Endo/Other  diabetes, Type 2, Oral Hypoglycemic Agents  Renal/GU      Musculoskeletal   Abdominal   Peds  Hematology   Anesthesia Other Findings   Reproductive/Obstetrics                             Anesthesia Physical Anesthesia Plan  ASA: II  Anesthesia Plan: MAC   Post-op Pain Management:    Induction: Intravenous  Airway Management Planned:   Additional Equipment:   Intra-op Plan:   Post-operative Plan:   Informed Consent: I have reviewed the patients History and Physical, chart, labs and discussed the procedure including the risks, benefits and alternatives for the proposed anesthesia with the patient or authorized representative who has indicated his/her understanding and acceptance.   Dental advisory given  Plan Discussed with: CRNA  Anesthesia Plan Comments:         Anesthesia Quick Evaluation

## 2016-09-15 NOTE — Anesthesia Postprocedure Evaluation (Signed)
Anesthesia Post Note  Patient: Keith Trevino  Procedure(s) Performed: Procedure(s) (LRB): COLONOSCOPY WITH PROPOFOL (N/A)     Patient location during evaluation: PACU Anesthesia Type: MAC Level of consciousness: awake and alert Pain management: pain level controlled Vital Signs Assessment: post-procedure vital signs reviewed and stable Respiratory status: spontaneous breathing Cardiovascular status: stable Anesthetic complications: no    Last Vitals:  Vitals:   09/15/16 0849 09/15/16 0900  BP: 118/66 120/77  Pulse:  70  Resp: 16 16  Temp: 36.5 C     Last Pain:  Vitals:   09/15/16 0849  TempSrc: Oral                 Nolon Nations

## 2016-09-15 NOTE — Op Note (Signed)
[facility] Patient Name: Keith Trevino Procedure Date: 09/15/2016 MRN: [id number] Attending MD: Charolett Bumpers , MD Date of Birth: 12-16-1965 CSN: [id number] Age: 51 Admit Type: Outpatient Procedure:                Colonoscopy Indications:              Screening for colorectal malignant neoplasm Providers:                Charolett Bumpers, MD, Omelia Blackwater RN, RN,                            Jacqulyn Liner, Technician Referring MD:              Medicines:                Propofol per Anesthesia Complications:            No immediate complications. Estimated Blood Loss:     Estimated blood loss: none. Procedure:                Pre-Anesthesia Assessment:                           - Prior to the procedure, a History and Physical                            was performed, and patient medications and                            allergies were reviewed. The patient's tolerance of                            previous anesthesia was also reviewed. The risks                            and benefits of the procedure and the sedation                            options and risks were discussed with the patient.                            All questions were answered, and informed consent                            was obtained. Prior Anticoagulants: The patient has                            taken aspirin, last dose was 1 day prior to                            procedure. ASA Grade Assessment: II - A patient                            with mild systemic disease. After reviewing the  risks and benefits, the patient was deemed in                            satisfactory condition to undergo the procedure.                           After obtaining informed consent, the colonoscope                            was passed under direct vision. Throughout the                            procedure, the patient's blood pressure, pulse, and                             oxygen saturations were monitored continuously. The                            EC-[id number] ([id number]) scope was introduced through                            the anus and advanced to the the cecum, identified                            by appendiceal orifice and ileocecal valve. The                            colonoscopy was performed without difficulty. The                            patient tolerated the procedure well. The quality                            of the bowel preparation was good. The terminal                            ileum, the ileocecal valve, the appendiceal orifice                            and the rectum were photographed. Scope In: 8:26:31 AM Scope Out: 8:41:00 AM Scope Withdrawal Time: 0 hours 10 minutes 21 seconds  Total Procedure Duration: 0 hours 14 minutes 29 seconds  Findings:      The perianal and digital rectal examinations were normal.      The entire examined colon appeared normal. Impression:               - The entire examined colon is normal.                           - No specimens collected. Moderate Sedation:      N/A- Per Anesthesia Care Recommendation:           - Patient has a contact number available for  emergencies. The signs and symptoms of potential                            delayed complications were discussed with the                            patient. Return to normal activities tomorrow.                            Written discharge instructions were provided to the                            patient.                           - Repeat colonoscopy in 10 years for screening                            purposes.                           - Resume previous diet.                           - Continue present medications. Procedure Code(s):        --- Professional ---                           Z6109G0121, Colorectal cancer screening; colonoscopy on                            individual not meeting criteria for high  risk Diagnosis Code(s):        --- Professional ---                           Z12.11, Encounter for screening for malignant                            neoplasm of colon CPT copyright 2016 American Medical Association. All rights reserved. The codes documented in this report are preliminary and upon coder review may  be revised to meet current compliance requirements. Danise EdgeMartin Johnson, MD Charolett BumpersMartin K Johnson, MD 09/15/2016 8:46:39 AM This report has been signed electronically. Number of Addenda: 0

## 2016-09-15 NOTE — H&P (Signed)
Procedure: Baseline screening colonoscopy.  History: The patient is a 51 year old male born [birth year]. He is scheduled to undergo his first screening colonoscopy with polypectomy to prevent colon cancer. There is no family history of colon cancer.  Past medical history: Type 2 diabetes mellitus. Allergic rhinitis. Kidney stone. Hypertension.  Medication allergies: Victoza caused skin rash  Exam: The patient is alert and lying comfortably on the endoscopy stretcher. Abdomen is soft and nontender to palpation. Lungs are clear to auscultation. Cardiac exam reveals a regular rhythm.  Plan: Proceed with screening colonoscopy

## 2016-09-15 NOTE — Discharge Instructions (Signed)
Colonoscopy, Adult, Care After  This sheet gives you information about how to care for yourself after your procedure. Your health care provider may also give you more specific instructions. If you have problems or questions, contact your health care provider.  What can I expect after the procedure?  After the procedure, it is common to have:  · A small amount of blood in your stool for 24 hours after the procedure.  · Some gas.  · Mild abdominal cramping or bloating.    Follow these instructions at home:  General instructions    · For the first 24 hours after the procedure:  ? Do not drive or use machinery.  ? Do not sign important documents.  ? Do not drink alcohol.  ? Do your regular daily activities at a slower pace than normal.  ? Eat soft, easy-to-digest foods.  ? Rest often.  · Take over-the-counter or prescription medicines only as told by your health care provider.  · It is up to you to get the results of your procedure. Ask your health care provider, or the department performing the procedure, when your results will be ready.  Relieving cramping and bloating  · Try walking around when you have cramps or feel bloated.  · Apply heat to your abdomen as told by your health care provider. Use a heat source that your health care provider recommends, such as a moist heat pack or a heating pad.  ? Place a towel between your skin and the heat source.  ? Leave the heat on for 20-30 minutes.  ? Remove the heat if your skin turns bright red. This is especially important if you are unable to feel pain, heat, or cold. You may have a greater risk of getting burned.  Eating and drinking  · Drink enough fluid to keep your urine clear or pale yellow.  · Resume your normal diet as instructed by your health care provider. Avoid heavy or fried foods that are hard to digest.  · Avoid drinking alcohol for as long as instructed by your health care provider.  Contact a health care provider if:  · You have blood in your stool 2-3  days after the procedure.  Get help right away if:  · You have more than a small spotting of blood in your stool.  · You pass large blood clots in your stool.  · Your abdomen is swollen.  · You have nausea or vomiting.  · You have a fever.  · You have increasing abdominal pain that is not relieved with medicine.  This information is not intended to replace advice given to you by your health care provider. Make sure you discuss any questions you have with your health care provider.  Document Released: 11/13/2003 Document Revised: 12/24/2015 Document Reviewed: 06/12/2015  Elsevier Interactive Patient Education © 2018 [employer]

## 2016-09-15 NOTE — Transfer of Care (Signed)
Immediate Anesthesia Transfer of Care Note  Patient: Keith Trevino  Procedure(s) Performed: Procedure(s): COLONOSCOPY WITH PROPOFOL (N/A)  Patient Location: PACU  Anesthesia Type:MAC  Level of Consciousness:  sedated, patient cooperative and responds to stimulation  Airway & Oxygen Therapy:Patient Spontanous Breathing and Patient connected to face mask oxgen  Post-op Assessment:  Report given to PACU RN and Post -op Vital signs reviewed and stable  Post vital signs:  Reviewed and stable  Last Vitals:  Vitals:   09/15/16 0731  BP: 136/81  Pulse: 70  Resp: 16  Temp: 26.3 C    Complications: No apparent anesthesia complications

## 2016-09-17 ENCOUNTER — Encounter (HOSPITAL_COMMUNITY): Payer: Self-pay | Admitting: Gastroenterology

## 2016-09-23 DIAGNOSIS — M5441 Lumbago with sciatica, right side: Secondary | ICD-10-CM | POA: Diagnosis not present

## 2016-09-23 DIAGNOSIS — M9904 Segmental and somatic dysfunction of sacral region: Secondary | ICD-10-CM | POA: Diagnosis not present

## 2016-09-23 DIAGNOSIS — M5442 Lumbago with sciatica, left side: Secondary | ICD-10-CM | POA: Diagnosis not present

## 2016-09-23 DIAGNOSIS — M9903 Segmental and somatic dysfunction of lumbar region: Secondary | ICD-10-CM | POA: Diagnosis not present

## 2016-09-25 DIAGNOSIS — M9903 Segmental and somatic dysfunction of lumbar region: Secondary | ICD-10-CM | POA: Diagnosis not present

## 2016-09-25 DIAGNOSIS — M5442 Lumbago with sciatica, left side: Secondary | ICD-10-CM | POA: Diagnosis not present

## 2016-09-25 DIAGNOSIS — M0000 Staphylococcal arthritis, unspecified joint: Secondary | ICD-10-CM | POA: Diagnosis not present

## 2016-09-25 DIAGNOSIS — M5441 Lumbago with sciatica, right side: Secondary | ICD-10-CM | POA: Diagnosis not present

## 2016-09-29 DIAGNOSIS — M5442 Lumbago with sciatica, left side: Secondary | ICD-10-CM | POA: Diagnosis not present

## 2016-09-29 DIAGNOSIS — M9903 Segmental and somatic dysfunction of lumbar region: Secondary | ICD-10-CM | POA: Diagnosis not present

## 2016-09-29 DIAGNOSIS — M5441 Lumbago with sciatica, right side: Secondary | ICD-10-CM | POA: Diagnosis not present

## 2016-09-29 DIAGNOSIS — M9904 Segmental and somatic dysfunction of sacral region: Secondary | ICD-10-CM | POA: Diagnosis not present

## 2016-09-30 MED FILL — INVOKANA 300 MG TABLET: 300 | 30 days supply | Qty: 30 | Fill #2

## 2016-09-30 MED FILL — JANUVIA 100 MG TABLET: 100 | 90 days supply | Qty: 90 | Fill #0

## 2016-09-30 MED FILL — RAMIPRIL 5 MG CAPSULE: 5 | 90 days supply | Qty: 90 | Fill #2

## 2016-09-30 MED FILL — METFORMIN HCL ER 500 MG TAB: 500 | 90 days supply | Qty: 360 | Fill #2

## 2016-09-30 MED FILL — GLIMEPIRIDE 4 MG TABLET: 4 | 30 days supply | Qty: 60 | Fill #0

## 2016-10-02 DIAGNOSIS — M9903 Segmental and somatic dysfunction of lumbar region: Secondary | ICD-10-CM | POA: Diagnosis not present

## 2016-10-02 DIAGNOSIS — M9904 Segmental and somatic dysfunction of sacral region: Secondary | ICD-10-CM | POA: Diagnosis not present

## 2016-10-02 DIAGNOSIS — M5441 Lumbago with sciatica, right side: Secondary | ICD-10-CM | POA: Diagnosis not present

## 2016-10-02 DIAGNOSIS — M5442 Lumbago with sciatica, left side: Secondary | ICD-10-CM | POA: Diagnosis not present

## 2016-10-30 MED FILL — GLIMEPIRIDE 4 MG TABLET: 4 | 30 days supply | Qty: 60 | Fill #1

## 2016-10-31 ENCOUNTER — Other Ambulatory Visit: Payer: Self-pay | Admitting: Internal Medicine

## 2016-10-31 MED ORDER — CANAGLIFLOZIN 300 MG PO TABS: 300.0000 mg | ORAL_TABLET | Freq: Every day | ORAL | 11 refills | Status: DC

## 2016-10-31 MED FILL — INVOKANA 300 MG TABLET: 300 | 30 days supply | Qty: 30 | Fill #0

## 2016-11-26 ENCOUNTER — Encounter: Payer: Self-pay | Admitting: Internal Medicine

## 2016-11-26 ENCOUNTER — Encounter: Payer: Self-pay | Admitting: Neurology

## 2016-11-26 ENCOUNTER — Ambulatory Visit: Payer: 59 | Admitting: Internal Medicine

## 2016-11-26 VITALS — BP 130/82 | HR 68 | Ht 69.0 in | Wt 197.0 lb

## 2016-11-26 DIAGNOSIS — E785 Hyperlipidemia, unspecified: Secondary | ICD-10-CM | POA: Diagnosis not present

## 2016-11-26 DIAGNOSIS — E782 Mixed hyperlipidemia: Secondary | ICD-10-CM | POA: Diagnosis not present

## 2016-11-26 DIAGNOSIS — I10 Essential (primary) hypertension: Secondary | ICD-10-CM | POA: Diagnosis not present

## 2016-11-26 DIAGNOSIS — E119 Type 2 diabetes mellitus without complications: Secondary | ICD-10-CM | POA: Diagnosis not present

## 2016-11-26 DIAGNOSIS — M79605 Pain in left leg: Secondary | ICD-10-CM | POA: Diagnosis not present

## 2016-11-26 DIAGNOSIS — E1165 Type 2 diabetes mellitus with hyperglycemia: Secondary | ICD-10-CM

## 2016-11-26 DIAGNOSIS — R202 Paresthesia of skin: Secondary | ICD-10-CM | POA: Diagnosis not present

## 2016-11-26 DIAGNOSIS — Z7984 Long term (current) use of oral hypoglycemic drugs: Secondary | ICD-10-CM | POA: Diagnosis not present

## 2016-11-26 LAB — HEMOGLOBIN A1C: Hemoglobin A1C: 7.8

## 2016-11-26 MED ORDER — FREESTYLE LIBRE SENSOR SYSTEM MISC: 1.0000 | 11 refills | Status: DC

## 2016-11-26 MED ORDER — FREESTYLE LIBRE READER DEVI: 1.0000 | Freq: Three times a day (TID) | 1 refills | Status: DC

## 2016-11-26 MED FILL — FREESTYLE LIBRE READER DEVI: 30 days supply | Qty: 1 | Fill #0

## 2016-11-26 MED FILL — FREESTYLE LIBRE SENSOR SYST: 30 days supply | Qty: 3 | Fill #0

## 2016-11-26 NOTE — Patient Instructions (Addendum)
Please continue: - Metformin ER 1000 mg 2x a day - Glimepiride 4 mg 2x a day before meals. Please take 1.5 tabs of Glimepiride before dinner for a larger meal or if you have dessert. - Invokana 300 mg in am - Januvia 100 mg in am.  Please return in 3 months with your sugar log.

## 2016-11-26 NOTE — Progress Notes (Signed)
Patient ID: Keith Trevino, male   DOB: May 30, 1965, 51 y.o.   MRN: [id number]   HPI: Keith Trevino is a 51 y.o.-year-old male, initially referred by his PCP,Husain, Karrar, MD, returning for follow-up for DM2, dx initially prediabetes in 2005, non-insulin-dependent, uncontrolled, without long term complications. He previously saw Dr. Leslie DalesAltheimer, last visit with him 01/10/2016. Last visit with me in 04/2016.  He comes from his primary care doctor's office, where he had an HbA1c checked - pending. He is in the [employer].  Since last visit, he did not check sugars consistently and does not bring a sugar log or his meter.  Reviewed previous hemoglobin A1c levels: Lab Results  Component Value Date   HGBA1C 7.7 03/20/2016   HGBA1C 9.0 11/06/2014  01/10/2016: HbA1c 7.6% Fructosamine 355 (calculated HbA1c 7.6%) 09/2015: HbA1c 7.4% 06/2015: HbA1c 8.5% 03/2015: HbA1c 9.9% 01/2015: HbA1c 9.8%, C-peptide 2.9, GAD antibody negative - per review of Dr. Berline LopesAltheimer's notes  At last visit, we switched to: - Metformin ER 2000 mg daily with dinner (He had diarrhea with instant release metformin) >> b'fast - Glimepiride 4 mg 2x a day before meals - Invokana 300 mg in am - Januvia 100 mg in a.m. - started 04/2016 He stopped Tanzeum 50 mg weekly >> he ran out end of 2017. He could not tolerate  Byetta, Victoza, Trulicity, and Bydureon 2/2 AP. He was on Actos.  Pt checks his sugars 0-1x a day: - am: 120-150 >> 129-147 >> 120-140s - 2h after b'fast: n/c - before lunch: n/c - 2h after lunch: n/c - before dinner: n/c >> 137 >> n/c - 2h after dinner: n/c >> 148 >> n/c  - bedtime: n/c >> 159-218 >> up to 190 - nighttime: n/c  He wore a CGM FreeStyle Libre (Pro) in 03/2015: No nocturnal hypoglycemia tendency, highest values and highest variability after supper  Glucometer: [employer] Next  Pt's meals are: - Breakfast: eggs + bacon OR grits OR oatmeal; coffee + milk - Lunch: meat +  veggies - Dinner: meat + veggies - Snacks: PB, sugar-free cookies - after dinner He [facility] 3 months.  - No CKD, last BUN/creatinine:  01/07/2016: 20/0.94, glucose 190, AST/ALT 16/23 No results found for: BUN, CREATININE  On the ramipril. - last set of lipids: 01/07/2076: 162/130/49/87 No results found for: CHOL, HDL, LDLCALC, LDLDIRECT, TRIG, CHOLHDL  On Zocor. - last eye exam was in 02/2016 >> [facility]. - He denies numbness and tingling in his feet. Last foot exam 12/2015.  ROS: Constitutional: no weight gain/no weight loss, no fatigue, no subjective hyperthermia, no subjective hypothermia Eyes: no blurry vision, no xerophthalmia ENT: no sore throat, no nodules palpated in throat, no dysphagia, no odynophagia, no hoarseness Cardiovascular: no CP/no SOB/no palpitations/no leg swelling Respiratory: no cough/no SOB/no wheezing Gastrointestinal: no N/no V/no D/no C/no acid reflux Musculoskeletal: + muscle aches/no joint aches Skin: no rashes, no hair loss Neurological: no tremors/no numbness/no tingling/no dizziness  I reviewed pt's medications, allergies, PMH, social hx, family hx, and changes were documented in the history of present illness. Otherwise, unchanged from my initial visit note.  Past Medical History:  Diagnosis Date  . Diabetes mellitus without complication (HCC)   . Hypertension    Past Surgical History:  Procedure Laterality Date  . COLONOSCOPY WITH PROPOFOL N/A 09/15/2016   Procedure: COLONOSCOPY WITH PROPOFOL;  Surgeon: Charolett BumpersJohnson, Martin K, MD;  Location: [facility];  Service: Endoscopy;  Laterality: N/A;   Social History   Social History  . Marital status: Married    Spouse name: N/A  . Number of children: 2   Occupational History  . [employer]    Social History Main Topics  . Smoking status: Never Smoker  . Smokeless tobacco: Never Used  . Alcohol use Not on file   . Drug use: Unknown   Current Outpatient Prescriptions on File Prior to Visit  Medication Sig Dispense Refill  . aspirin 81 MG tablet Take 81 mg by mouth daily.    . canagliflozin (INVOKANA) 300 MG TABS tablet Take 1 tablet (300 mg total) by mouth daily before breakfast. 30 tablet 11  . fluticasone (FLONASE) 50 MCG/ACT nasal spray Place 1 spray into both nostrils daily as needed for allergies.     Marland Kitchen glimepiride (AMARYL) 4 MG tablet Take 4 mg by mouth 2 (two) times daily.     Marland Kitchen ibuprofen (ADVIL,MOTRIN) 200 MG tablet Take 400 mg by mouth every 6 (six) hours as needed (two tablets). Two tablets     . levocetirizine (XYZAL) 5 MG tablet Take 1 tablet (5 mg total) by mouth every evening. 90 tablet 3  . metFORMIN (GLUCOPHAGE-XR) 500 MG 24 hr tablet Date by mouth total of 2000 mg daily (Patient taking differently: Take 1,000 mg by mouth daily with breakfast. ) 360 tablet 3  . Multiple Vitamin (MULTIVITAMIN) tablet Take 1 tablet by mouth daily.    Marland Kitchen olopatadine (PATANOL) 0.1 % ophthalmic solution Place 1-2 drops into both eyes 2 (two) times daily as needed for allergies.   5  . ramipril (ALTACE) 5 MG capsule Take 5 mg by mouth daily.     . simvastatin (ZOCOR) 10 MG tablet Take 10 mg by mouth every evening.     . sitaGLIPtin (JANUVIA) 100 MG tablet Take 100 mg by mouth daily.     No current facility-administered medications on file prior to visit.    Other medications: - Januvia 100 mg daily in a.m. - Tanzeum 50 mg weekly - INVOKANA 300 mg daily in a.m.  Allergies  Allergen Reactions  . Actos [Pioglitazone] Other (See Comments)    Weight gain  . Bydureon [Exenatide] Other (See Comments)    Caused abdominal pain  . Trulicity [Dulaglutide]     Caused abdominal pain  . Victoza [Liraglutide] Rash   Family History  Problem Relation Age of Onset  . Diabetes Other   . Hypertension Other   . Stroke Other   . Heart attack Other   . Diabetes Mother   . Kidney disease Mother   . Cancer Father     PE: BP 130/82 (BP Location: Left Arm, Patient Position: Sitting)   Pulse 68   Ht 5\' 9"  (1.753 m)   Wt 197 lb (89.4 kg)   SpO2 97%   BMI 29.09 kg/m   Wt Readings from Last 3 Encounters:  11/26/16 197 lb (89.4 kg)  09/15/16 198 lb (89.8 kg)  05/12/16 198 lb (89.8 kg)   Constitutional: overweight, in NAD Eyes: PERRLA, EOMI, no exophthalmos ENT: moist mucous membranes, no thyromegaly, no cervical lymphadenopathy Cardiovascular: RRR, No MRG Respiratory: CTA B Gastrointestinal: abdomen soft, NT, ND, BS+ Musculoskeletal: no deformities, strength intact in all 4 Skin: moist, warm, no rashes Neurological: no tremor with outstretched hands, DTR normal in all 4  ASSESSMENT: 1. DM2, non-insulin-dependent, uncontrolled, without long term complications, but with hyperglycemia  2. HL  PLAN:  1. Patient with long-standing, uncontrolled diabetes,  on oral antidiabetic regimen and previously on GLP-1 receptor agonist, of which he ran out before last visit. We switched to a DPP 4 inhibitor at that time, but unfortunately, he now returns after a long absence without checking sugars consistently (he does have some high sugars at night, though). We also cannot check an HbA1c point-of-care today since he just had it checked today by venous blood at his PCPs office. I have no choice but to continue the current regimen and wait for his HbA1c to return >>will ask for records from PCP. - I strongly advised him to start checking sugars consistently and I also suggested that he gets the FreeStyle libre CGM. I explained how this works and he agrees to start. - I suggested to:  Patient Instructions  Please continue: - Metformin ER 1000 mg 2x a day - Glimepiride 4 mg 2x a day before meals. Please take 1.5 tabs of Glimepiride before dinner for a larger meal or if you have dessert. - Invokana 300 mg in am - Januvia 100 mg in am.  Please return in 3 months with your sugar log.   - We will wait for the HbA1c  checked by PCP  - continue checking sugars at different times of the day - check 1x a day, rotating checks - advised for yearly eye exams >> he is UTD - Return to clinic in 3 mo with sugar log   2. HL -  reviewed latest lipid panel   - I believe he had another one checked by PCP today - continue Zocor  Carlus Pavlov, MD PhD [facility]

## 2016-11-27 ENCOUNTER — Other Ambulatory Visit: Payer: Self-pay | Admitting: *Deleted

## 2016-11-27 DIAGNOSIS — M79605 Pain in left leg: Secondary | ICD-10-CM

## 2016-11-27 MED FILL — INVOKANA 300 MG TABLET: 300 | 30 days supply | Qty: 30 | Fill #1

## 2016-11-27 MED FILL — GLIMEPIRIDE 4 MG TABLET: 4 | 30 days supply | Qty: 60 | Fill #2

## 2016-12-08 MED FILL — OLOPATADINE HCL 0.1 % SOLN: 0.1 | 25 days supply | Qty: 5 | Fill #2

## 2016-12-22 MED FILL — GLIMEPIRIDE 4 MG TABLET: 4 | 30 days supply | Qty: 60 | Fill #3

## 2016-12-22 MED FILL — INVOKANA 300 MG TABLET: 300 | 30 days supply | Qty: 30 | Fill #2

## 2016-12-22 MED FILL — FREESTYLE LIBRE SENSOR SYST: 30 days supply | Qty: 3 | Fill #1

## 2016-12-22 MED FILL — JANUVIA 100 MG TABLET: 100 | 90 days supply | Qty: 90 | Fill #1

## 2016-12-23 ENCOUNTER — Ambulatory Visit (INDEPENDENT_AMBULATORY_CARE_PROVIDER_SITE_OTHER): Payer: 59 | Admitting: Neurology

## 2016-12-23 DIAGNOSIS — M79605 Pain in left leg: Secondary | ICD-10-CM

## 2016-12-23 NOTE — Procedures (Signed)
[facility]  [address], [address]  [place], [place] [address] Tel: [phone] Fax:  [phone] Test Date:  12/23/2016  Patient: Keith Trevino DOB: June 25, 1965 Physician: Nita Sickleonika Patel, DO  Sex: Male Height: 5\' 9"  Ref Phys: Georgann HousekeeperKarrar Husain, MD  ID#: [id number] Temp: 32.6C Technician:    Patient Complaints: This is a 51 year-old man referred for evaluation of left leg numbness and achy pain.  NCV & EMG Findings: Extensive electrodiagnostic testing of the left lower extremity shows:  1. Left sural and superficial peroneal sensory responses within normal limits. 2. Left peroneal and tibial motor responses are within normal limits. Of note, there is evidence of an accessory peroneal nerve as noted by a higher proximal motor response at the when stimulating the peroneal nerve, as well as a motor response when stimulating at the medial malleolus.  3. Left tibial H reflex study is within normal limits. 4. There is no evidence of an active or chronic motor axon loss changes affecting any of the tested muscles.   Impression: This is a normal study of the left lower extremity. In particular, there is no evidence of a lumbosacral radiculopathy or sensorimotor polyneuropathy.  Incidentally, there is a left accessory peroneal nerve, a normal variant.   ___________________________ Nita Sickleonika Patel, DO    Nerve Conduction Studies Anti Sensory Summary Table   Stim Site NR Peak (ms) Norm Peak (ms) P-T Amp (V) Norm P-T Amp  Left Sup Peroneal Anti Sensory (Ant Lat Mall)  12 cm    3.1 <4.6 18.1 >4  Left Sural Anti Sensory (Lat Mall)  Calf    2.8 <4.6 9.5 >4   Motor Summary Table   Stim Site NR Onset (ms) Norm Onset (ms) O-P Amp (mV) Norm O-P Amp Site1 Site2 Delta-0 (ms) Dist (cm) Vel (m/s) Norm Vel (m/s)  Left Peroneal Motor (Ext Dig Brev)  Ankle    3.9 <6.0 2.6 >2.5 B Fib Ankle 7.5 38.0 51 >40  B Fib    11.4  4.0  Poplt B Fib 1.8 10.0 56 >40  Poplt    13.2  3.9           Medial malleolus    5.0  6.8         Left Tibial Motor (Abd Hall Brev)  Ankle    3.6 <6.0 16.3 >4 Knee Ankle 9.0 39.0 43 >40  Knee    12.6  12.7          H Reflex Studies   NR H-Lat (ms) Lat Norm (ms) L-R H-Lat (ms)  Left Tibial (Gastroc)     33.74 <35    EMG   Side Muscle Ins Act Fibs Psw Fasc Number Recrt Dur Dur. Amp Amp. Poly Poly. Comment  Left AntTibialis Nml Nml Nml Nml Nml Nml Nml Nml Nml Nml Nml Nml N/A  Left Gastroc Nml Nml Nml Nml Nml Nml Nml Nml Nml Nml Nml Nml N/A  Left Flex Dig Long Nml Nml Nml Nml Nml Nml Nml Nml Nml Nml Nml Nml N/A  Left RectFemoris Nml Nml Nml Nml Nml Nml Nml Nml Nml Nml Nml Nml N/A  Left GluteusMed Nml Nml Nml Nml Nml Nml Nml Nml Nml Nml Nml Nml N/A  Left BicepsFemS Nml Nml Nml Nml Nml Nml Nml Nml Nml Nml Nml Nml N/A      Waveforms:

## 2017-01-05 MED FILL — METFORMIN HCL ER 500 MG TAB: 500 | 90 days supply | Qty: 360 | Fill #3

## 2017-01-06 MED FILL — RAMIPRIL 5 MG CAPSULE: 5 | 90 days supply | Qty: 90 | Fill #0

## 2017-01-14 MED FILL — SIMVASTATIN 10 MG TABLET: 10 | 90 days supply | Qty: 90 | Fill #0

## 2017-01-20 MED FILL — FREESTYLE LIBRE SENSOR SYST: 30 days supply | Qty: 3 | Fill #2

## 2017-01-20 MED FILL — INVOKANA 300 MG TABLET: 300 | 30 days supply | Qty: 30 | Fill #3

## 2017-02-12 MED FILL — GLIMEPIRIDE 4 MG TABLET: 4 | 30 days supply | Qty: 60 | Fill #0

## 2017-02-23 ENCOUNTER — Ambulatory Visit (INDEPENDENT_AMBULATORY_CARE_PROVIDER_SITE_OTHER): Payer: 59 | Admitting: Internal Medicine

## 2017-02-23 ENCOUNTER — Encounter: Payer: Self-pay | Admitting: Internal Medicine

## 2017-02-23 DIAGNOSIS — E785 Hyperlipidemia, unspecified: Secondary | ICD-10-CM

## 2017-02-23 DIAGNOSIS — E1165 Type 2 diabetes mellitus with hyperglycemia: Secondary | ICD-10-CM | POA: Diagnosis not present

## 2017-02-23 LAB — POCT GLYCOSYLATED HEMOGLOBIN (HGB A1C): Hemoglobin A1C: 7.7

## 2017-02-23 MED ORDER — SEMAGLUTIDE(0.25 OR 0.5MG/DOS) 2 MG/1.5ML ~~LOC~~ SOPN: 0.2500 mg | PEN_INJECTOR | SUBCUTANEOUS | 5 refills | Status: DC

## 2017-02-23 NOTE — Progress Notes (Signed)
Patient ID: Olen Pelimothy J Siwek, male   DOB: [birth year], 51 y.o.   MRN: [id number]   HPI: Olen Pelimothy J Beissel is a 51 y.o.-year-old male, initially referred by his PCP,Husain, Karrar, MD, returning for follow-up for DM2, dx initially prediabetes in 2005, non-insulin-dependent, uncontrolled, without long term complications. He previously saw Dr. Leslie DalesAltheimer, last visit with him 01/10/2016. Last visit with me 3 months ago:  He has a CGM: FreeStyle Libre  Reviewed previous hemoglobin A1c levels: Lab Results  Component Value Date   HGBA1C 7.8 11/26/2016   HGBA1C 7.7 03/20/2016   HGBA1C 9.0 11/06/2014  01/10/2016: HbA1c 7.6% Fructosamine 355 (calculated HbA1c 7.6%) 09/2015: HbA1c 7.4% 06/2015: HbA1c 8.5% 03/2015: HbA1c 9.9% 01/2015: HbA1c 9.8%, C-peptide 2.9, GAD antibody negative - per review of Dr. Berline LopesAltheimer's notes  He is on: - Metformin ER 1000 mg 2x a day - Glimepiride 4 mg 2x a day before meals. Please take 1.5 tabs of Glimepiride before dinner for a larger meal or if you have dessert. - Invokana 300 mg in am - Januvia 100 mg in am. He stopped Tanzeum 50 mg weekly >> he ran out end of 2017. He could not tolerate  Byetta, Victoza, Trulicity, and Bydureon 2/2 AP. He was on Actos.  Pt checks his sugars 0-1x a day, but has a CGM: CGM parameters: - will scan reports - Average from CGM: 148 +/- 31  Time in range:  - very low (<54): 0%  - low (50-70): 0% - normal range (70-180): 86% - high sugars (180-250): 14% - very high sugars (250-400): 0%  - am: 120-150 >> 129-147 >> 120-140s >> 120-160 - 2h after b'fast: n/c - before lunch: n/c - 2h after lunch: n/c - before dinner: n/c >> 137 >> n/c - 2h after dinner: n/c >> 148 >> n/c  - bedtime: n/c >> 159-218 >> up to 190 >> 150-210 - nighttime: n/c  He wore a CGM FreeStyle Libre (Pro) in 03/2015: No nocturnal hypoglycemia tendency, highest values and highest variability after supper  Glucometer: [employer] Cont[employer] Next  Pt's meals are: -  Breakfast: eggs + bacon OR grits OR oatmeal; coffee + milk - Lunch: meat + veggies - Dinner: meat + veggies - Snacks: PB, sugar-free cookies - after dinner He sees The [facility] every 3 months.  -No CKD, last BUN/creatinine:  01/07/2016: 20/0.94, glucose 190, AST/ALT 16/23 No results found for: BUN, CREATININE  On Ramipril - last set of lipids: 01/07/2016: 162/130/49/87 No results found for: CHOL, HDL, LDLCALC, LDLDIRECT, TRIG, CHOLHDL  On Zocor. - last eye exam was in 02/2016: [facility]. -Denies numbness and tingling in his feet. Last foot exam 12/2015.  ROS: Constitutional: no weight gain/no weight loss, no fatigue, no subjective hyperthermia, no subjective hypothermia Eyes: no blurry vision, no xerophthalmia ENT: no sore throat, no nodules palpated in throat, no dysphagia, no odynophagia, no hoarseness Cardiovascular: no CP/no SOB/no palpitations/no leg swelling Respiratory: no cough/no SOB/no wheezing Gastrointestinal: no N/no V/no D/no C/no acid reflux Musculoskeletal: no muscle aches/no joint aches Skin: no rashes, no hair loss Neurological: no tremors/no numbness/no tingling/no dizziness  I reviewed pt's medications, allergies, PMH, social hx, family hx, and changes were documented in the history of present illness. Otherwise, unchanged from my initial visit note.  Past Medical History:  Diagnosis Date  . Diabetes mellitus without complication (HCC)   . Hypertension    No past surgical history on file. Social History   Social History  .  Marital status: Married    Spouse name: N/A  . Number of children: 2   Occupational History  . [employer]    Social History Main Topics  . Smoking status: Never Smoker  . Smokeless tobacco: Never Used  . Alcohol use Not on file  . Drug use: Unknown   Current Outpatient Medications on File Prior to Visit  Medication Sig Dispense Refill  . aspirin 81 MG tablet  Take 81 mg by mouth daily.    . canagliflozin (INVOKANA) 300 MG TABS tablet Take 1 tablet (300 mg total) by mouth daily before breakfast. 30 tablet 11  . Continuous Blood Gluc Receiver (FREESTYLE LIBRE READER) DEVI 1 Device by Does not apply route 3 (three) times daily. 1 Device 1  . Continuous Blood Gluc Sensor (FREESTYLE LIBRE SENSOR SYSTEM) MISC 1 Device by Does not apply route every 30 (thirty) days. 3 each 11  . fluticasone (FLONASE) 50 MCG/ACT nasal spray Place 1 spray into both nostrils daily as needed for allergies.     Marland Kitchen glimepiride (AMARYL) 4 MG tablet Take 4 mg by mouth 2 (two) times daily.     Marland Kitchen ibuprofen (ADVIL,MOTRIN) 200 MG tablet Take 400 mg by mouth every 6 (six) hours as needed (two tablets). Two tablets     . levocetirizine (XYZAL) 5 MG tablet Take 1 tablet (5 mg total) by mouth every evening. 90 tablet 3  . metFORMIN (GLUCOPHAGE-XR) 500 MG 24 hr tablet Date by mouth total of 2000 mg daily (Patient taking differently: Take 1,000 mg by mouth daily with breakfast. ) 360 tablet 3  . Multiple Vitamin (MULTIVITAMIN) tablet Take 1 tablet by mouth daily.    Marland Kitchen olopatadine (PATANOL) 0.1 % ophthalmic solution Place 1-2 drops into both eyes 2 (two) times daily as needed for allergies.   5  . ramipril (ALTACE) 5 MG capsule Take 5 mg by mouth daily.     . simvastatin (ZOCOR) 10 MG tablet Take 10 mg by mouth every evening.     . sitaGLIPtin (JANUVIA) 100 MG tablet Take 100 mg by mouth daily.     No current facility-administered medications on file prior to visit.    Other medications: - Januvia 100 mg daily in a.m. - Tanzeum 50 mg weekly - INVOKANA 300 mg daily in a.m.  Allergies  Allergen Reactions  . Actos [Pioglitazone] Other (See Comments)    Weight gain  . Bydureon [Exenatide] Other (See Comments)    Caused abdominal pain  . Trulicity [Dulaglutide]     Caused abdominal pain  . Victoza [Liraglutide] Rash   Family History  Problem Relation Age of Onset  . Diabetes Other   .  Hypertension Other   . Stroke Other   . Heart attack Other   . Diabetes Mother   . Kidney disease Mother   . Cancer Father    PE: BP 118/74 (BP Location: Left Arm, Patient Position: Sitting, Cuff Size: Normal)   Pulse 63   Temp 98.8 F (37.1 C) (Oral)   Ht 5\' 8"  (1.727 m)   Wt 198 lb 8 oz (90 kg)   SpO2 97%   BMI 30.18 kg/m  Wt Readings from Last 3 Encounters:  02/23/17 198 lb 8 oz (90 kg)  11/26/16 197 lb (89.4 kg)  09/15/16 198 lb (89.8 kg)   Constitutional: overweight, in NAD Eyes: PERRLA, EOMI, no exophthalmos ENT: moist mucous membranes, no thyromegaly, no cervical lymphadenopathy Cardiovascular: RRR, No MRG Respiratory: CTA B Gastrointestinal: abdomen soft, NT,  ND, BS+ Musculoskeletal: no deformities, strength intact in all 4 Skin: moist, warm, no rashes Neurological: no tremor with outstretched hands, DTR normal in all 4  ASSESSMENT: 1. DM2, non-insulin-dependent, uncontrolled, without long term complications, but with hyperglycemia  2. HL  PLAN:  1. Patient with long-standing, uncontrolled diabetes, on oral antidiabetic regimen previously on GLP-1 receptor agonist, of which he could only tolerate Tanzeum.  He tried the rest of them and had abdominal pain. - His sugars today are still slightly high-however, he is not checking his sugars with the CGM during the day, therefore, the CGM reports are only from the beginning and the end of the day.  I suspect that his sugars during the day or even higher, related to the meals. - We discussed about the need to intensify his treatment and the options that we have are: Try the Ozempic which is a new GLP-1 receptor agonist or try mealtime insulin.  He agrees to try Ozempic first.  We will stop Januvia if he can start the GLP-1 receptor agonist - I advised him to: Patient Instructions  Please continue: - Metformin ER 1000 mg 2x a day - Glimepiride 4 mg 2x a day before meals. Please take 1.5 tabs of Glimepiride before dinner  for a larger meal or if you have dessert. - Invokana 300 mg in am - Januvia 100 mg in am.  Start: - Ozempic 0.25 mg weekly x 4 weeks, then increase to 0.5 mg.  Stop Januvia before 2nd Ozempic dose.  Please return in 3 months with your sugar log.   - today, HbA1c is 7.7% (same) - continue checking sugars at different times of the day - check 1x a day, rotating checks - advised for yearly eye exams >> he is UTD - Return to clinic in 3 mo with sugar log   2. HL - Reviewed latest lipid panel - continue Zocor - no SEs  Carlus Pavlovristina Marymargaret Kirker, MD PhD [facility]

## 2017-02-23 NOTE — Patient Instructions (Addendum)
Please continue: - Metformin ER 1000 mg 2x a day - Glimepiride 4 mg 2x a day before meals. Please take 1.5 tabs of Glimepiride before dinner for a larger meal or if you have dessert. - Invokana 300 mg in am - Januvia 100 mg in am.  Start: - Ozempic 0.25 mg weekly x 4 weeks, then increase to 0.5 mg.  Stop Januvia before 2nd Ozempic dose.  Please return in 3 months with your sugar log.

## 2017-03-04 ENCOUNTER — Other Ambulatory Visit: Payer: Self-pay

## 2017-03-04 MED ORDER — SEMAGLUTIDE(0.25 OR 0.5MG/DOS) 2 MG/1.5ML ~~LOC~~ SOPN: 0.2500 mg | PEN_INJECTOR | SUBCUTANEOUS | 10 refills | Status: DC

## 2017-03-09 MED FILL — GLIMEPIRIDE 4 MG TABLET: 4 | 30 days supply | Qty: 60 | Fill #1

## 2017-03-09 MED FILL — FREESTYLE LIBRE SENSOR SYST: 30 days supply | Qty: 3 | Fill #3

## 2017-03-10 ENCOUNTER — Telehealth: Payer: Self-pay

## 2017-03-10 MED ORDER — DULAGLUTIDE 0.75 MG/0.5ML ~~LOC~~ SOAJ: SUBCUTANEOUS | 1 refills | Status: AC

## 2017-03-10 NOTE — Telephone Encounter (Signed)
Sent Trulicity 0.75mg  for a weekly injection to the pharmacy per Dr. Charlean SanfilippoGherghe's request since a [facility]

## 2017-03-11 MED FILL — INVOKANA 300 MG TABLET: 300 | 30 days supply | Qty: 30 | Fill #4

## 2017-03-16 ENCOUNTER — Telehealth: Payer: Self-pay | Admitting: Internal Medicine

## 2017-03-16 ENCOUNTER — Other Ambulatory Visit: Payer: Self-pay | Admitting: *Deleted

## 2017-03-16 NOTE — Telephone Encounter (Signed)
Patient had extreme side effects/reactions from Trulicity, Victoza, Duron. [employer] does not want to cover Ozempic. Patient does not want to take Trulicity, Victoza, or Duron ever again. Please call patient and advise ph# [phone] [facility]

## 2017-03-16 NOTE — Patient Outreach (Signed)
Keith Trevino transitioned from the [employer] program to the [employer] digital assistant platform on 04/28/16 for Type II diabetes self-management assistance so will close case to the diabetes Link To Wellness program due to delegation of disease management services to [employer] from [employer] for [employer] plan members in 2019. Keith RichardJanet S. Latesa Fratto RN,CCM,CDE [facility] Care Management Coordinator Link To Wellness and [employer] Office Phone [phone] Office Fax [phone]

## 2017-03-16 NOTE — Telephone Encounter (Signed)
OK to try a PA for Ozempic

## 2017-03-16 NOTE — Telephone Encounter (Signed)
Per previous message we received the PA request for Ozempic and switched patient to Trulicity. With patients request and side effects is it okay to do the PA for Ozempic? Please advise

## 2017-03-17 NOTE — Telephone Encounter (Signed)
Submitted his PA over the phone since CoverMyMeds would not accept it. PA [phone]. Pt is aware of this and the 24-72 hour turn around time

## 2017-03-19 ENCOUNTER — Telehealth: Payer: Self-pay | Admitting: Internal Medicine

## 2017-03-19 MED FILL — OZEMPIC 0.25 OR 0.5 MG/DOSE: 2 | 56 days supply | Qty: 2 | Fill #0

## 2017-03-20 MED ORDER — SEMAGLUTIDE(0.25 OR 0.5MG/DOS) 2 MG/1.5ML ~~LOC~~ SOPN: 0.2500 mg | PEN_INJECTOR | SUBCUTANEOUS | 10 refills | Status: DC

## 2017-03-20 NOTE — Telephone Encounter (Signed)
PA was approved for Ozempic for pt. Good from 03/19/17-03/18/18. Resent to pharmacy with that message

## 2017-04-02 ENCOUNTER — Other Ambulatory Visit: Payer: Self-pay | Admitting: Internal Medicine

## 2017-04-02 MED FILL — JANUVIA 100 MG TABLET: 100 | 90 days supply | Qty: 90 | Fill #2

## 2017-04-02 MED FILL — GLIMEPIRIDE 4 MG TABLET: 4 | 30 days supply | Qty: 60 | Fill #2

## 2017-04-02 MED FILL — RAMIPRIL 5 MG CAPSULE: 5 | 90 days supply | Qty: 90 | Fill #1

## 2017-04-03 MED FILL — METFORMIN HCL ER 500 MG TAB: 500 | 90 days supply | Qty: 360 | Fill #0

## 2017-04-13 MED FILL — INVOKANA 300 MG TABLET: 300 | 30 days supply | Qty: 30 | Fill #5

## 2017-04-21 ENCOUNTER — Telehealth: Payer: Self-pay | Admitting: Internal Medicine

## 2017-04-21 NOTE — Telephone Encounter (Signed)
Pt started Ozempic on Sunday, pt has a rash on his face, neck,  and neck. Did have nausea but that subsided. Is taking benadryl to see if that helps. Please advise

## 2017-04-21 NOTE — Telephone Encounter (Signed)
Please let me know before the end of the day tomorrow about how he feels. We may still repeat 1 dose of Ozempic if the rash is mild.

## 2017-04-22 NOTE — Telephone Encounter (Signed)
Pt is aware and agrees to give it a try

## 2017-04-22 NOTE — Telephone Encounter (Signed)
Noted.  If he is willing to repeat the Ozempic, I would suggest to premedicate with 1 dose of Benadryl for the next injection, and if she has no problems with it, I would just take the next dose without Benadryl.

## 2017-04-22 NOTE — Telephone Encounter (Signed)
Pt stated that he has taken 2 benadryl doses and it has cleared up today. He is still slightly "itchy" but that is the only symptom left

## 2017-04-29 ENCOUNTER — Telehealth: Payer: Self-pay

## 2017-04-29 NOTE — Telephone Encounter (Signed)
Received fax about Freestyle libre sensors being rejected per insurance. Dr. Elvera LennoxGherghe noted to inform pt. Called and spoke with pt who is now calling to speak with his insurance to see what is neccessary because he now has a high deductible plan. Waiting for pt to call back with infromation

## 2017-04-29 NOTE — Telephone Encounter (Signed)
Pt called back and stated that they kept trying to get him to iniate a PA and he was just trying to see a price. Pt is calling one other number and I will iniate the PA once I get the fax from the pharmacy

## 2017-04-30 NOTE — Telephone Encounter (Signed)
Pt is calling to speak with you regarding the Keith Trevino have we received the paperwork from pharmacy

## 2017-05-01 MED ORDER — FREESTYLE LIBRE 14 DAY SENSOR MISC: 1.0000 | 3 refills | Status: DC

## 2017-05-01 MED ORDER — FREESTYLE LIBRE 14 DAY READER DEVI: 1.0000 | Freq: Once | 0 refills | Status: AC

## 2017-05-01 NOTE — Telephone Encounter (Signed)
PA was iniated today with Keith Trevino  and the PA number is [id number], 24-72 hour turn around time

## 2017-05-01 NOTE — Telephone Encounter (Signed)
Have to iniate over the phone will call today

## 2017-05-01 NOTE — Addendum Note (Signed)
Addended by: Yolande JollyLAWSON, [place] on: 05/01/2017 08:38 AM   Modules accepted: Orders

## 2017-05-08 ENCOUNTER — Telehealth: Payer: Self-pay | Admitting: Internal Medicine

## 2017-05-08 NOTE — Telephone Encounter (Signed)
Called [employer] and left detailed message for Rinaldo Cloudamela on private line said pt is not insulin dependent or using an insulin pump. Reference numbers given and number to call back if any further questions.

## 2017-05-08 NOTE — Telephone Encounter (Signed)
Rinaldo Cloudamela from [facility] called stated that they need to know if... patient is insulin dependent? Or if patient is using the insulin pump?  These are PA references numbers # [phone]  Fax # [phone]

## 2017-05-13 MED FILL — GLIMEPIRIDE 4 MG TABLET: 4 | 30 days supply | Qty: 60 | Fill #3

## 2017-05-13 MED FILL — SIMVASTATIN 10 MG TABLET: 10 | 90 days supply | Qty: 90 | Fill #1

## 2017-05-13 MED FILL — INVOKANA 300 MG TABLET: 300 | 30 days supply | Qty: 30 | Fill #6

## 2017-05-13 NOTE — Telephone Encounter (Signed)
Patient is asking you to give him a call. °

## 2017-05-14 NOTE — Telephone Encounter (Signed)
Pt stated that he does not want to go back to pricking his finger to take his sugars, but he "guesses that he will." Is calling to check his preferred meter with insurance and will then have that sent in

## 2017-05-14 NOTE — Telephone Encounter (Signed)
Patient ask you to call him °

## 2017-05-14 NOTE — Telephone Encounter (Signed)
LVM on cell phone number listed stating his PA for Freestyle Josephine IgoLibre has been denied  which per DPR in chart is allowed by patient

## 2017-05-15 MED ORDER — ACCU-CHEK GUIDE W/DEVICE KIT: 1.0000 | PACK | Freq: Once | 0 refills | Status: AC

## 2017-05-15 MED ORDER — GLUCOSE BLOOD VI STRP: ORAL_STRIP | 1 refills | Status: AC

## 2017-05-15 MED ORDER — ACCU-CHEK FASTCLIX LANCETS MISC: 1 refills | Status: AC

## 2017-05-15 NOTE — Telephone Encounter (Signed)
Keith Trevino is calling you on the status of patient freestyle libre [phone]

## 2017-05-15 NOTE — Telephone Encounter (Signed)
Sent in [employer] with supplies.

## 2017-05-15 NOTE — Telephone Encounter (Signed)
[facility] Fre[facility] libra sensor and reader that it is being denied. This was the 14 day. They needed prior authorzation for them  And would like to know if the authorzation has been started ,   Please advise  [phone] Please ask for [facility]

## 2017-05-15 NOTE — Telephone Encounter (Signed)
Spoke with tracy and she is letting maryann know that this has been denied, pt is aware its denied, and that I have faxed them to confirm denial

## 2017-05-25 MED FILL — OZEMPIC 0.25 OR 0.5 MG/DOSE: 2 | 56 days supply | Qty: 2 | Fill #1

## 2017-05-26 ENCOUNTER — Ambulatory Visit: Payer: Self-pay | Admitting: Internal Medicine

## 2017-05-28 MED FILL — CLOBETASOL 0.05% TOPICAL LO: 0.05 | 14 days supply | Qty: 59 | Fill #1

## 2017-06-16 MED FILL — INVOKANA 300 MG TABLET: 300 | 30 days supply | Qty: 30 | Fill #7

## 2017-06-16 MED FILL — GLIMEPIRIDE 4 MG TABLET: 4 | 30 days supply | Qty: 60 | Fill #0

## 2017-07-01 ENCOUNTER — Emergency Department (HOSPITAL_COMMUNITY)
Admission: EM | Admit: 2017-07-01 | Discharge: 2017-07-01 | Discharge status: Against Medical Advice | Payer: 59 | Attending: Emergency Medicine | Admitting: Emergency Medicine

## 2017-07-01 ENCOUNTER — Encounter (HOSPITAL_COMMUNITY): Payer: Self-pay | Admitting: Emergency Medicine

## 2017-07-01 DIAGNOSIS — Z5321 Procedure and treatment not carried out due to patient leaving prior to being seen by health care provider: Secondary | ICD-10-CM | POA: Insufficient documentation

## 2017-07-01 DIAGNOSIS — R42 Dizziness and giddiness: Secondary | ICD-10-CM | POA: Diagnosis not present

## 2017-07-01 LAB — BASIC METABOLIC PANEL
ANION GAP: 10 (ref 5–15)
BUN: 25 mg/dL — ABNORMAL HIGH (ref 6–20)
CALCIUM: 9.6 mg/dL (ref 8.9–10.3)
CO2: 22 mmol/L (ref 22–32)
Chloride: 104 mmol/L (ref 101–111)
Creatinine, Ser: 1.05 mg/dL (ref 0.61–1.24)
GLUCOSE: 169 mg/dL — AB (ref 65–99)
Potassium: 3.9 mmol/L (ref 3.5–5.1)
Sodium: 136 mmol/L (ref 135–145)

## 2017-07-01 LAB — CBC
HCT: 43 % (ref 39.0–52.0)
HEMOGLOBIN: 14.5 g/dL (ref 13.0–17.0)
MCH: 31.2 pg (ref 26.0–34.0)
MCHC: 33.7 g/dL (ref 30.0–36.0)
MCV: 92.5 fL (ref 78.0–100.0)
Platelets: 201 10*3/uL (ref 150–400)
RBC: 4.65 MIL/uL (ref 4.22–5.81)
RDW: 13.5 % (ref 11.5–15.5)
WBC: 8.5 10*3/uL (ref 4.0–10.5)

## 2017-07-01 LAB — URINALYSIS, ROUTINE W REFLEX MICROSCOPIC
Bacteria, UA: NONE SEEN
Bilirubin Urine: NEGATIVE
Glucose, UA: >=500 mg/dL — AB
HGB URINE DIPSTICK: NEGATIVE
Ketones, ur: 5 mg/dL — AB
Leukocytes, UA: NEGATIVE
Nitrite: NEGATIVE
PROTEIN: NEGATIVE mg/dL
Specific Gravity, Urine: 1.032 — ABNORMAL HIGH (ref 1.005–1.030)
pH: 5 (ref 5.0–8.0)

## 2017-07-01 LAB — CBG MONITORING, ED: Glucose-Capillary: 174 mg/dL — ABNORMAL HIGH (ref 65–99)

## 2017-07-01 NOTE — ED Triage Notes (Addendum)
Pt here visiting another patient when suddenly became dizzy, states he didn't feel good and became diaphoretic. Denies pain. No neuro deficits.

## 2017-07-01 NOTE — ED Notes (Signed)
Called pt no answer and check all of pt arm bands that was sleep and RR

## 2017-07-01 NOTE — ED Notes (Signed)
Called pt for room. No answer.  

## 2017-07-01 NOTE — ED Notes (Signed)
No answer when called at this time.

## 2017-07-01 NOTE — ED Notes (Signed)
Called pt no answer °

## 2017-07-13 MED FILL — OZEMPIC 0.25 OR 0.5 MG/DOSE: 2 | 56 days supply | Qty: 2 | Fill #2

## 2017-07-14 MED FILL — RAMIPRIL 5 MG CAPS: 5 | 90 days supply | Qty: 90 | Fill #2

## 2017-07-14 MED FILL — INVOKANA 300 MG TABLET: 300 | 30 days supply | Qty: 30 | Fill #8

## 2017-07-20 ENCOUNTER — Encounter: Payer: Self-pay | Admitting: Internal Medicine

## 2017-07-20 ENCOUNTER — Ambulatory Visit: Payer: 59 | Admitting: Internal Medicine

## 2017-07-20 VITALS — BP 126/82 | HR 76 | Ht 69.0 in | Wt 197.2 lb

## 2017-07-20 DIAGNOSIS — E1165 Type 2 diabetes mellitus with hyperglycemia: Secondary | ICD-10-CM | POA: Diagnosis not present

## 2017-07-20 DIAGNOSIS — E785 Hyperlipidemia, unspecified: Secondary | ICD-10-CM

## 2017-07-20 LAB — POCT GLYCOSYLATED HEMOGLOBIN (HGB A1C): HEMOGLOBIN A1C: 7.5

## 2017-07-20 MED ORDER — SEMAGLUTIDE(0.25 OR 0.5MG/DOS) 2 MG/1.5ML ~~LOC~~ SOPN: 0.5000 mg | PEN_INJECTOR | SUBCUTANEOUS | 5 refills | Status: DC

## 2017-07-20 NOTE — Progress Notes (Signed)
Patient ID: Keith Trevino, male   DOB: [birth year], 52 y.o.   MRN: [id number]   HPI: Keith Trevino is a 52 y.o.-year-old male, initially referred by his PCP,Husain, Karrar, MD, returning for follow-up for DM2, dx initially prediabetes in 2005, non-insulin-dependent, uncontrolled, without long term complications. He previously saw Dr. Leslie Trevino, last visit with him 01/10/2016. Last visit with me 5 months ago:  Reviewed previous hemoglobin A1c levels: Lab Results  Component Value Date   HGBA1C 7.7 02/23/2017   HGBA1C 7.8 11/26/2016   HGBA1C 7.7 03/20/2016  01/10/2016: HbA1c 7.6% Fructosamine 355 (calculated HbA1c 7.6%) 09/2015: HbA1c 7.4% 06/2015: HbA1c 8.5% 03/2015: HbA1c 9.9% 01/2015: HbA1c 9.8%, C-peptide 2.9, GAD antibody negative - per review of Dr. Berline Trevino notes  He is on: - Metformin ER 1000 mg 2x a day - Glimepiride 4 mg 2x a day before meals.  - Invokana 300 mg in am - Januvia >> Ozempic 0.25 mg weekly - he had a pruritic rash + Nausea with it immediately After 1st dose >> resolved  He stopped Tanzeum 50 mg weekly >> he ran out end of 2017. He could not tolerate  Byetta, Victoza, Trulicity, and Bydureon 2/2 AP. He was on Actos.  Pt checks his sugars multiple times a day with his CGM - need to stop b/c not covered: - am:  120-140s >> 120-160 >> 120-150 - 2h after b'fast: n/c - before lunch: n/c - 2h after lunch: n/c - before dinner: n/c >> 137 >> n/c >> 71-79 - 2h after dinner: n/c >> 148 >> n/c  - bedtime: up to 190 >> 150-210 >> n/c - nighttime: n/c  He wore a CGM FreeStyle Libre (Pro) in 03/2015: No nocturnal hypoglycemia tendency, highest values and highest variability after supper  Glucometer: [employer] Next  Pt's meals are: - Breakfast: eggs + bacon OR grits OR oatmeal; coffee + milk - Lunch: meat + veggies - Dinner: meat + veggies - Snacks: PB, sugar-free cookies - after dinner He sees The diabetes educator and dietitian at [facility] every 3  months.  - no CKD, last BUN/creatinine:  Lab Results  Component Value Date   BUN 25 (H) 07/01/2017   CREATININE 1.05 07/01/2017  01/07/2016: 20/0.94, glucose 190, AST/ALT 16/23 On Ramipril.  - + HL; last set of lipids: 01/07/2016: 162/130/49/87 No results found for: CHOL, HDL, LDLCALC, LDLDIRECT, TRIG, CHOLHDL  On Zocor.  - last eye exam was in 02/2016: No [facility]. [facility].  - no numbness and tingling in his feet.  ROS: Constitutional: no weight gain/no weight loss, no fatigue, no subjective hyperthermia, no subjective hypothermia Eyes: no blurry vision, no xerophthalmia ENT: no sore throat, no nodules palpated in throat, no dysphagia, no odynophagia, no hoarseness Cardiovascular: no CP/no SOB/no palpitations/no leg swelling Respiratory: no cough/no SOB/no wheezing Gastrointestinal: no N/no V/no D/no C/no acid reflux Musculoskeletal: no muscle aches/no joint aches Skin: no rashes, no hair loss Neurological: no tremors/no numbness/no tingling/no dizziness  I reviewed pt's medications, allergies, PMH, social hx, family hx, and changes were documented in the history of present illness. Otherwise, unchanged from my initial visit note.  Past Medical History:  Diagnosis Date  . Diabetes mellitus without complication (HCC)   . Hypertension    Past Surgical History:  Procedure Laterality Date  . COLONOSCOPY WITH PROPOFOL N/A 09/15/2016   Procedure: COLONOSCOPY WITH PROPOFOL;  Surgeon: Charolett Bumpers, MD;  Location: [facility];  Service: Endoscopy;  Laterality: N/A;   Social History   Social History  .  Marital status: Married    Spouse name: N/A  . Number of children: 2   Occupational History  . [employer]    Social History Main Topics  . Smoking status: Never Smoker  . Smokeless tobacco: Never Used  . Alcohol use Not on file  . Drug use: Unknown   Current Outpatient Medications on File Prior to Visit  Medication Sig Dispense Refill  .  ACCU-CHEK FASTCLIX LANCETS MISC Use to check sugars once a day 100 each 1  . aspirin 81 MG tablet Take 81 mg by mouth daily.    . canagliflozin (INVOKANA) 300 MG TABS tablet Take 1 tablet (300 mg total) by mouth daily before breakfast. 30 tablet 11  . Continuous Blood Gluc Sensor (FREESTYLE LIBRE 14 DAY SENSOR) MISC 1 each by Does not apply route every 14 (fourteen) days. 2 each 3  . Dulaglutide (TRULICITY) 0.75 MG/0.5ML SOPN Inject weekly 4 pen 1  . fluticasone (FLONASE) 50 MCG/ACT nasal spray Place 1 spray into both nostrils daily as needed for allergies.     Marland Kitchen. glimepiride (AMARYL) 4 MG tablet Take 4 mg by mouth 2 (two) times daily.     Marland Kitchen. glucose blood (ACCU-CHEK GUIDE) test strip Use to check sugars once daily 100 each 1  . ibuprofen (ADVIL,MOTRIN) 200 MG tablet Take 400 mg by mouth every 6 (six) hours as needed (two tablets). Two tablets     . levocetirizine (XYZAL) 5 MG tablet Take 1 tablet (5 mg total) by mouth every evening. 90 tablet 3  . metFORMIN (GLUCOPHAGE-XR) 500 MG 24 hr tablet TAKE 4 TABLETS BY MOUTH DAILY 360 tablet 1  . Multiple Vitamin (MULTIVITAMIN) tablet Take 1 tablet by mouth daily.    Marland Kitchen. olopatadine (PATANOL) 0.1 % ophthalmic solution Place 1-2 drops into both eyes 2 (two) times daily as needed for allergies.   5  . ramipril (ALTACE) 5 MG capsule Take 5 mg by mouth daily.     . Semaglutide (OZEMPIC) 0.25 or 0.5 MG/DOSE SOPN Inject 0.25 mg into the skin once a week. 1 pen 10  . simvastatin (ZOCOR) 10 MG tablet Take 10 mg by mouth every evening.     . sitaGLIPtin (JANUVIA) 100 MG tablet Take 100 mg by mouth daily.     No current facility-administered medications on file prior to visit.    Other medications: - Januvia 100 mg daily in a.m. - Tanzeum 50 mg weekly - INVOKANA 300 mg daily in a.m.  Allergies  Allergen Reactions  . Actos [Pioglitazone] Other (See Comments)    Weight gain  . Bydureon [Exenatide] Other (See Comments)    Caused abdominal pain  . Trulicity  [Dulaglutide]     Caused abdominal pain  . Victoza [Liraglutide] Rash   Family History  Problem Relation Age of Onset  . Diabetes Other   . Hypertension Other   . Stroke Other   . Heart attack Other   . Diabetes Mother   . Kidney disease Mother   . Cancer Father    PE: BP 126/82   Pulse 76   Ht 5\' 9"  (1.753 m)   Wt 197 lb 3.2 oz (89.4 kg)   SpO2 97%   BMI 29.12 kg/m  Wt Readings from Last 3 Encounters:  07/20/17 197 lb 3.2 oz (89.4 kg)  07/01/17 196 lb (88.9 kg)  02/23/17 198 lb 8 oz (90 kg)   Constitutional: overweight, in NAD Eyes: PERRLA, EOMI, no exophthalmos ENT: moist mucous membranes, no thyromegaly, no  cervical lymphadenopathy Cardiovascular: RRR, No MRG Respiratory: CTA B Gastrointestinal: abdomen soft, NT, ND, BS+ Musculoskeletal: no deformities, strength intact in all 4 Skin: moist, warm, no rashes Neurological: no tremor with outstretched hands, DTR normal in all 4  ASSESSMENT: 1. DM2, non-insulin-dependent, uncontrolled, without long term complications, but with hyperglycemia  2. HL  PLAN:  1. Patient with long standing, uncontrolled DM2, on oral antidiabetic regimen and Ozempic, started at last visit.  He was previously on Tanzeum.  He had abdominal pain with other GLP-1 receptor agonist in the past.   - He initially had a pruritic rash and some nausea from Ozempic, after the first dose, which resolved - at this visit, his sugars are still higher in am but low, in the 70s before dinner >> will decrease Amaryl dose >> stop am dose and try to increase Trulicity dose - I advised him to: Patient Instructions  Please continue: - Metformin ER 1000 mg 2x a day - Glimepiride 4 mg 2x a day before meals. Please take 1.5 tabs of Glimepiride before dinner for a larger meal or if you have dessert. - Invokana 300 mg in am  Please increase: - Ozempic to 0.5 mg weekly  Please return in 3 months with your sugar log.   - today, HbA1c is 7.5% (slightly better) -  continue checking sugars at different times of the day - check 1x a day, rotating checks - advised for yearly eye exams >> he is not UTD - new eye exam coming in June - Return to clinic in 3 mo with sugar log    2. HL -Reviewed latest Lipid panel from 02/2016 >> LDL <100 -continues on Zocor w/o SEs - he is due for another Lipid panel >> will have an appt with PCP soon  Carlus Pavlov, MD PhD [facility] Endocr[facility]

## 2017-07-20 NOTE — Patient Instructions (Addendum)
Please continue: - Metformin ER 1000 mg 2x a day - Invokana 300 mg in am  Please increase: - Ozempic to 0.5 mg weekly  Please change: - Glimepiride 4 mg to 1x a day before dinner  Please return in 3 months with your sugar log.

## 2017-07-23 MED FILL — ACCU-CHEK GUIDE TEST STRIP: 90 days supply | Qty: 100 | Fill #0

## 2017-08-04 MED FILL — METFORMIN HCL ER 500 MG TAB: 500 | 90 days supply | Qty: 360 | Fill #1

## 2017-08-04 MED FILL — GLIMEPIRIDE 4 MG TABLET: 4 | 30 days supply | Qty: 60 | Fill #1

## 2017-08-26 ENCOUNTER — Other Ambulatory Visit: Payer: Self-pay

## 2017-08-26 MED ORDER — SEMAGLUTIDE(0.25 OR 0.5MG/DOS) 2 MG/1.5ML ~~LOC~~ SOPN: 0.5000 mg | PEN_INJECTOR | SUBCUTANEOUS | 5 refills | Status: DC

## 2017-08-26 MED FILL — OZEMPIC 0.25 OR 0.5 MG/DOSE: 2 | 28 days supply | Qty: 2 | Fill #0

## 2017-08-26 MED FILL — INVOKANA 300 MG TABLET: 300 | 30 days supply | Qty: 30 | Fill #9

## 2017-09-18 MED FILL — SIMVASTATIN 10 MG TABLET: 10 | 30 days supply | Qty: 30 | Fill #0

## 2017-09-25 MED FILL — INVOKANA 300 MG TABLET: 300 | 30 days supply | Qty: 30 | Fill #10

## 2017-10-20 ENCOUNTER — Other Ambulatory Visit: Payer: Self-pay

## 2017-10-20 ENCOUNTER — Ambulatory Visit (INDEPENDENT_AMBULATORY_CARE_PROVIDER_SITE_OTHER): Payer: 59 | Admitting: Internal Medicine

## 2017-10-20 ENCOUNTER — Encounter: Payer: Self-pay | Admitting: Internal Medicine

## 2017-10-20 VITALS — BP 120/82 | HR 105 | Ht 69.25 in | Wt 195.8 lb

## 2017-10-20 DIAGNOSIS — E663 Overweight: Secondary | ICD-10-CM | POA: Diagnosis not present

## 2017-10-20 DIAGNOSIS — E785 Hyperlipidemia, unspecified: Secondary | ICD-10-CM | POA: Diagnosis not present

## 2017-10-20 DIAGNOSIS — E1165 Type 2 diabetes mellitus with hyperglycemia: Secondary | ICD-10-CM

## 2017-10-20 LAB — POCT GLYCOSYLATED HEMOGLOBIN (HGB A1C): HEMOGLOBIN A1C: 7.4 % — AB (ref 4.0–5.6)

## 2017-10-20 NOTE — Patient Instructions (Addendum)
Please continue: - Metformin ER 1000 mg 2x a day - Glimepiride 4 mg before dinner.  Please take 1.5 tablets of Glimepiride before dinner for a larger meal or if you have dessert. - Invokana 300 mg in am - Ozempic 0.5 mg weekly  Start checking sugars once a day, rotating check times.  Please return in 3-4 months with your sugar log.

## 2017-10-20 NOTE — Progress Notes (Signed)
Patient ID: Keith Trevino, male   DOB: [birth year], 52 y.o.   MRN: [id number]   HPI: Keith Trevino is a 52 y.o.-year-old male, initially referred by his PCP,Husain, Karrar, MD, returning for follow-up for DM2, dx initially prediabetes in 2005, non-insulin-dependent, uncontrolled, without long term complications. He previously saw Dr. Leslie DalesAltheimer, last visit with him 01/10/2016. Last visit with me 3 months ago.  He ate more sweets since last visit. He only checked sugars seldom in last 3 mo.  Reviewed previous hemoglobin A1c levels: Lab Results  Component Value Date   HGBA1C 7.5 07/20/2017   HGBA1C 7.7 02/23/2017   HGBA1C 7.8 11/26/2016  01/10/2016: HbA1c 7.6% Fructosamine 355 (calculated HbA1c 7.6%) 09/2015: HbA1c 7.4% 06/2015: HbA1c 8.5% 03/2015: HbA1c 9.9% 01/2015: HbA1c 9.8%, C-peptide 2.9, GAD antibody negative - per review of Dr. Berline LopesAltheimer's notes  He is on: - Metformin ER 1000 mg 2x a day - Glimepiride 4 mg before dinner.  Please take 1.5 tablets of Glimepiride before dinner for a larger meal or if you have dessert - Invokana 300 mg in am - Ozempic 0.5 mg weekly  He stopped Tanzeum 50 mg weekly >> he ran out end of 2017. He could not tolerate  Byetta, Victoza, Trulicity, and Bydureon 2/2 AP. He was on Actos.  He checks sugars seldom: - am:  120-140s >> 120-160 >> 120-150 >> 125 - 2h after b'fast: n/c - before lunch: n/c - 2h after lunch: n/c - before dinner: n/c >> 137 >> n/c >> 71-79 >> 88 - 2h after dinner: n/c >> 148 >> n/c  - bedtime: up to 190 >> 150-210 >> n/c - nighttime: n/c  He wore a CGM FreeStyle Libre (Pro) in 03/2015: No nocturnal hypoglycemia tendency, highest values and highest variability after supper  Glucometer: [employer]  Pt's meals are: - Breakfast: eggs + bacon OR grits OR oatmeal; coffee + milk - Lunch: meat + veggies - Dinner: meat + veggies - Snacks: PB, sugar-free cookies - after dinner  -No CKD, last BUN/creatinine:  Lab  Results  Component Value Date   BUN 25 (H) 07/01/2017   CREATININE 1.05 07/01/2017  01/07/2016: 20/0.94, glucose 190, AST/ALT 16/23 On ramipril.  -+ HL; last set of lipids: 01/07/2016: 162/130/49/87 No results found for: CHOL, HDL, LDLCALC, LDLDIRECT, TRIG, CHOLHDL  On Zocor.  - last [facility] in [facility]2016[facility]. Has one scheduled in 12/2017.  -Denies numbness and tingling in his feet.  ROS: Constitutional: no weight gain/no weight loss, no fatigue, no subjective hyperthermia, no subjective hypothermia Eyes: no blurry vision, no xerophthalmia ENT: no sore throat, no nodules palpated in throat, no dysphagia, no odynophagia, no hoarseness Cardiovascular: no CP/no SOB/no palpitations/no leg swelling Respiratory: no cough/no SOB/no wheezing Gastrointestinal: no N/no V/no D/no C/no acid reflux Musculoskeletal: no muscle aches/no joint aches Skin: no rashes, no hair loss Neurological: no tremors/no numbness/no tingling/no dizziness  I reviewed pt's medications, allergies, PMH, social hx, family hx, and changes were documented in the history of present illness. Otherwise, unchanged from my initial visit note.  Past Medical History:  Diagnosis Date  . Diabetes mellitus without complication (HCC)   . Hypertension    Past Surgical History:  Procedure Laterality Date  . COLONOSCOPY WITH PROPOFOL N/A 09/15/2016   Procedure: COLONOSCOPY WITH PROPOFOL;  Surgeon: Charolett BumpersJohnson, Martin K, MD;  Location: [facility];  Service: Endoscopy;  Laterality: N/A;   Social History   Social History  . Marital status: Married  Spouse name: N/A  . Number of children: 2   Occupational History  . [employer]    Social History Main Topics  . Smoking status: Never Smoker  . Smokeless tobacco: Never Used  . Alcohol use Not on file  . Drug use: Unknown   Current Outpatient Medications on File Prior to Visit  Medication Sig Dispense Refill  . ACCU-CHEK FASTCLIX  LANCETS MISC Use to check sugars once a day 100 each 1  . aspirin 81 MG tablet Take 81 mg by mouth daily.    . canagliflozin (INVOKANA) 300 MG TABS tablet Take 1 tablet (300 mg total) by mouth daily before breakfast. 30 tablet 11  . Continuous Blood Gluc Sensor (FREESTYLE LIBRE 14 DAY SENSOR) MISC 1 each by Does not apply route every 14 (fourteen) days. (Patient not taking: Reported on 07/20/2017) 2 each 3  . Dulaglutide (TRULICITY) 0.75 MG/0.5ML SOPN Inject weekly 4 pen 1  . fluticasone (FLONASE) 50 MCG/ACT nasal spray Place 1 spray into both nostrils daily as needed for allergies.     Marland Kitchen glimepiride (AMARYL) 4 MG tablet Take 4 mg by mouth daily.    Marland Kitchen glucose blood (ACCU-CHEK GUIDE) test strip Use to check sugars once daily 100 each 1  . ibuprofen (ADVIL,MOTRIN) 200 MG tablet Take 400 mg by mouth every 6 (six) hours as needed (two tablets). Two tablets     . levocetirizine (XYZAL) 5 MG tablet Take 1 tablet (5 mg total) by mouth every evening. 90 tablet 3  . metFORMIN (GLUCOPHAGE-XR) 500 MG 24 hr tablet TAKE 4 TABLETS BY MOUTH DAILY 360 tablet 1  . Multiple Vitamin (MULTIVITAMIN) tablet Take 1 tablet by mouth daily.    Marland Kitchen olopatadine (PATANOL) 0.1 % ophthalmic solution Place 1-2 drops into both eyes 2 (two) times daily as needed for allergies.   5  . ramipril (ALTACE) 5 MG capsule Take 5 mg by mouth daily.     . Semaglutide (OZEMPIC) 0.25 or 0.5 MG/DOSE SOPN Inject 0.5 mg into the skin once a week. 2 pen 5  . simvastatin (ZOCOR) 10 MG tablet Take 10 mg by mouth every evening.     . sitaGLIPtin (JANUVIA) 100 MG tablet Take 100 mg by mouth daily.     No current facility-administered medications on file prior to visit.    Other medications: - Januvia 100 mg daily in a.m. - Tanzeum 50 mg weekly - INVOKANA 300 mg daily in a.m.  Allergies  Allergen Reactions  . Actos [Pioglitazone] Other (See Comments)    Weight gain  . Bydureon [Exenatide] Other (See Comments)    Caused abdominal pain  .  Trulicity [Dulaglutide]     Caused abdominal pain  . Victoza [Liraglutide] Rash   Family History  Problem Relation Age of Onset  . Diabetes Other   . Hypertension Other   . Stroke Other   . Heart attack Other   . Diabetes Mother   . Kidney disease Mother   . Cancer Father    PE: BP 120/82   Pulse (!) 105   Ht 5' 9.25" (1.759 m)   Wt 195 lb 12.8 oz (88.8 kg)   SpO2 98%   BMI 28.71 kg/m  Wt Readings from Last 3 Encounters:  10/20/17 195 lb 12.8 oz (88.8 kg)  07/20/17 197 lb 3.2 oz (89.4 kg)  07/01/17 196 lb (88.9 kg)   Constitutional: overweight, in NAD Eyes: PERRLA, EOMI, no exophthalmos ENT: moist mucous membranes, no thyromegaly, no cervical lymphadenopathy Cardiovascular:  tachycardia, RR, No MRG Respiratory: CTA B Gastrointestinal: abdomen soft, NT, ND, BS+ Musculoskeletal: no deformities, strength intact in all 4 Skin: moist, warm, no rashes Neurological: no tremor with outstretched hands, DTR normal in all 4  ASSESSMENT: 1. DM2, non-insulin-dependent, uncontrolled, without long term complications, but with hyperglycemia  2. HL  3. Overweight  PLAN:  1. Patient with long-standing, uncontrolled, type 2 diabetes, on oral antidiabetic regimen and also Ozempic (previously on Tanzeum).  Of note, he had abdominal pain with other GLP-1 receptor agonist in the past.  At last visit, we increased the Ozempic dose to 0.5 mg weekly.  No GI side effects from this.  He did have a pruritic rash and some nausea from Ozempic right after we started, but this resolved.  At last visit, he was having higher sugars in the morning but low CBGs, 70s, before dinner.  We stopped his a.m. Amaryl dose at that time. - At last visit, HbA1c was slightly better, at 7.5%; today, HbA1c is 7.4% (slightly better) - At this visit, he is not checking sugars, and only had few values checked since last visit.  These are at goal. I strongly advised him to start checking sugars at different times of the day  - check once a day, rotating checks - I do not feel that we need to make any changes in his regimen for now but he is aware that he needs to reduce his sweets intake and start checking his sugars.  At next visit, if the HbA1c does not decrease, we may need to increase Ozempic.  However, I will need more CBG data to be able to make this decision. - I advised him to: Patient Instructions  Please continue: - Metformin ER 1000 mg 2x a day - Glimepiride 4 mg before dinner.  Please take 1.5 tablets of Glimepiride before dinner for a larger meal or if you have dessert - Invokana 300 mg in am - Ozempic 0.5 mg weekly  Please return in 3-4 months with your sugar log.   - advised for yearly eye exams >> he is not UTD, but has an appointment scheduled. - Return to clinic in 3 mo with sugar log     2. HL - Reviewed latest lipid panel from 02/2016: LDL less than 100 - Continues Zocor without side effects.   - He is due for another lipid panel.  He has an appointment with PCP tomorrow for an annual physical exam.  I will need to obtain his records.  3. Overweight -No significant weight loss since last visit  -Ozempic should help with weight loss, as well as Invokana  Carlus Pavlov, MD PhD [facility] Endocrinology

## 2017-10-21 DIAGNOSIS — Z125 Encounter for screening for malignant neoplasm of prostate: Secondary | ICD-10-CM | POA: Diagnosis not present

## 2017-10-21 DIAGNOSIS — Z1389 Encounter for screening for other disorder: Secondary | ICD-10-CM | POA: Diagnosis not present

## 2017-10-21 DIAGNOSIS — I10 Essential (primary) hypertension: Secondary | ICD-10-CM | POA: Diagnosis not present

## 2017-10-21 DIAGNOSIS — E1169 Type 2 diabetes mellitus with other specified complication: Secondary | ICD-10-CM | POA: Diagnosis not present

## 2017-10-21 DIAGNOSIS — Z0000 Encounter for general adult medical examination without abnormal findings: Secondary | ICD-10-CM | POA: Diagnosis not present

## 2017-10-21 DIAGNOSIS — Z7984 Long term (current) use of oral hypoglycemic drugs: Secondary | ICD-10-CM | POA: Diagnosis not present

## 2017-10-21 MED FILL — OZEMPIC 0.25 OR 0.5 MG/DOSE: 2 | 28 days supply | Qty: 2 | Fill #1

## 2017-10-21 MED FILL — KETOCONAZOLE CR/HC 1% CR: 2%/1% | 7 days supply | Qty: 30 | Fill #0

## 2017-10-22 MED FILL — GLIMEPIRIDE 4 MG TABLET: 4 | 30 days supply | Qty: 60 | Fill #2

## 2017-10-22 MED FILL — RAMIPRIL 5 MG CAPS: 5 | 90 days supply | Qty: 90 | Fill #0

## 2017-10-22 MED FILL — INVOKANA 300 MG TABLET: 300 | 30 days supply | Qty: 30 | Fill #11

## 2017-10-22 MED FILL — SIMVASTATIN 10 MG TABLET: 10 | 30 days supply | Qty: 30 | Fill #0

## 2017-11-19 MED FILL — OZEMPIC 0.25 OR 0.5 MG/DOSE: 2 | 28 days supply | Qty: 2 | Fill #2

## 2017-11-23 ENCOUNTER — Other Ambulatory Visit: Payer: Self-pay | Admitting: Internal Medicine

## 2017-11-23 MED FILL — METFORMIN HCL ER 500 MG TAB: 500 | 90 days supply | Qty: 360 | Fill #0

## 2017-11-23 MED FILL — INVOKANA 300 MG TABLET: 300 | 30 days supply | Qty: 30 | Fill #0

## 2017-11-23 MED FILL — SIMVASTATIN 10 MG TABLET: 10 | 30 days supply | Qty: 30 | Fill #1

## 2017-12-18 MED FILL — GLIMEPIRIDE 4 MG TABLET: 4 | 30 days supply | Qty: 60 | Fill #3

## 2017-12-18 MED FILL — INVOKANA 300 MG TABLET: 300 | 30 days supply | Qty: 30 | Fill #1

## 2017-12-18 MED FILL — OZEMPIC 0.25 OR 0.5 MG/DOSE: 2 | 28 days supply | Qty: 2 | Fill #3

## 2017-12-18 MED FILL — SIMVASTATIN 10 MG TABLET: 10 | 30 days supply | Qty: 30 | Fill #2

## 2018-01-01 DIAGNOSIS — H524 Presbyopia: Secondary | ICD-10-CM | POA: Diagnosis not present

## 2018-01-01 DIAGNOSIS — H40053 Ocular hypertension, bilateral: Secondary | ICD-10-CM | POA: Diagnosis not present

## 2018-01-11 MED FILL — INVOKANA 300 MG TABLET: 300 | 30 days supply | Qty: 30 | Fill #2

## 2018-01-11 MED FILL — GLIMEPIRIDE 4 MG TABLET: 4 | 30 days supply | Qty: 60 | Fill #0

## 2018-01-11 MED FILL — SIMVASTATIN 10 MG TABLET: 10 | 30 days supply | Qty: 30 | Fill #3

## 2018-01-11 MED FILL — OZEMPIC 0.25 OR 0.5 MG/DOSE: 2 | 28 days supply | Qty: 2 | Fill #4

## 2018-01-11 MED FILL — RAMIPRIL 5 MG CAPS: 5 | 90 days supply | Qty: 90 | Fill #1

## 2018-01-11 MED FILL — KETOCONAZOLE CR/HC 1% CR: 2%/1% | 7 days supply | Qty: 30 | Fill #1

## 2018-02-10 ENCOUNTER — Ambulatory Visit: Payer: Self-pay | Admitting: Internal Medicine

## 2018-02-10 DIAGNOSIS — Z0289 Encounter for other administrative examinations: Secondary | ICD-10-CM

## 2018-03-01 MED FILL — SIMVASTATIN 10 MG TABLET: 10 | 30 days supply | Qty: 30 | Fill #4

## 2018-03-01 MED FILL — metFORMIN HCL ER 500 MG TB2: 500 | 30 days supply | Qty: 120 | Fill #1

## 2018-03-04 MED FILL — OZEMPIC 0.25 OR 0.5 MG/DOSE: 2 | 28 days supply | Qty: 2 | Fill #5

## 2018-03-17 MED FILL — INVOKANA 300 MG TABLET: 300 | 30 days supply | Qty: 30 | Fill #3

## 2018-03-26 ENCOUNTER — Telehealth: Payer: Self-pay

## 2018-03-26 MED ORDER — EMPAGLIFLOZIN 25 MG PO TABS: 25.0000 mg | ORAL_TABLET | Freq: Every day | ORAL | 2 refills | Status: DC

## 2018-03-26 MED FILL — JARDIANCE 25 MG TABLET: 25 | 30 days supply | Qty: 30 | Fill #0

## 2018-03-26 NOTE — Telephone Encounter (Signed)
PA for Invokana has been denied, RX changed to Jardiance 25 mg a day.  New RX sent to pharmacy.

## 2018-03-26 NOTE — Telephone Encounter (Signed)
Patient notified of new RX

## 2018-04-13 MED FILL — OZEMPIC 0.25 OR 0.5 MG/DOSE: 2 | 28 days supply | Qty: 2 | Fill #6

## 2018-04-20 MED FILL — SIMVASTATIN 10 MG TABLET: 10 | 30 days supply | Qty: 30 | Fill #5

## 2018-05-10 MED FILL — JARDIANCE 25 MG TABLET: 25 | 30 days supply | Qty: 30 | Fill #1

## 2018-05-10 MED FILL — OZEMPIC 0.25 OR 0.5 MG/DOSE: 2 | 28 days supply | Qty: 2 | Fill #7

## 2018-05-10 MED FILL — RAMIPRIL 5 MG CAPS: 5 | 90 days supply | Qty: 90 | Fill #2

## 2018-05-10 MED FILL — GLIMEPIRIDE 4 MG TABLET: 4 | 30 days supply | Qty: 60 | Fill #1

## 2018-05-21 ENCOUNTER — Other Ambulatory Visit: Payer: Self-pay | Admitting: Internal Medicine

## 2018-05-21 ENCOUNTER — Telehealth: Payer: Self-pay | Admitting: Internal Medicine

## 2018-05-21 DIAGNOSIS — E1165 Type 2 diabetes mellitus with hyperglycemia: Secondary | ICD-10-CM

## 2018-05-21 NOTE — Telephone Encounter (Deleted)
des

## 2018-05-21 NOTE — Telephone Encounter (Signed)
Patient called and requested that a referral be sent to [facility]. He has to switch endocrinologist due to insurance.  Call [phone]  With any questions or concerns

## 2018-05-21 NOTE — Telephone Encounter (Signed)
Done

## 2018-06-03 MED FILL — OZEMPIC 0.25 OR 0.5 MG/DOSE: 2 | 28 days supply | Qty: 2 | Fill #8

## 2018-06-03 MED FILL — metFORMIN HCL ER 500 MG TB2: 500 | 30 days supply | Qty: 120 | Fill #2 | Status: TO

## 2018-06-03 MED FILL — JARDIANCE 25 MG TABLET: 25 | 30 days supply | Qty: 30 | Fill #2

## 2018-06-03 MED FILL — GLIMEPIRIDE 4 MG TABLET: 4 | 30 days supply | Qty: 60 | Fill #2

## 2018-06-03 MED FILL — KETOCONAZOLE CR/HC 1% CR: 2%/1% | 7 days supply | Qty: 30 | Fill #2

## 2018-06-04 MED FILL — SIMVASTATIN 10 MG TABLET: 10 | 30 days supply | Qty: 30 | Fill #0 | Status: TO

## 2018-06-11 ENCOUNTER — Telehealth: Payer: Self-pay | Admitting: Internal Medicine

## 2018-06-11 ENCOUNTER — Other Ambulatory Visit: Payer: Self-pay | Admitting: Internal Medicine

## 2018-06-11 DIAGNOSIS — E1165 Type 2 diabetes mellitus with hyperglycemia: Secondary | ICD-10-CM

## 2018-06-11 NOTE — Telephone Encounter (Signed)
Done

## 2018-06-11 NOTE — Telephone Encounter (Signed)
Patient requests a Referral to [facility]  Endocrinology in [place] fax# [phone].

## 2018-07-05 ENCOUNTER — Other Ambulatory Visit: Payer: Self-pay | Admitting: Internal Medicine

## 2018-07-05 MED FILL — SIMVASTATIN 10 MG TABS: 10 | 90 days supply | Qty: 90 | Fill #0

## 2018-07-05 MED FILL — metFORMIN HCL ER 500 MG TB2: 500 | 30 days supply | Qty: 120 | Fill #0

## 2018-07-06 MED FILL — JARDIANCE 25 MG TABLET: 25 | 90 days supply | Qty: 90 | Fill #0

## 2018-07-29 ENCOUNTER — Encounter: Payer: Self-pay | Admitting: Internal Medicine

## 2018-07-29 MED ORDER — SEMAGLUTIDE(0.25 OR 0.5MG/DOS) 2 MG/1.5ML ~~LOC~~ SOPN: 0.5000 mg | PEN_INJECTOR | SUBCUTANEOUS | 1 refills | Status: AC

## 2018-07-29 MED ORDER — GLIMEPIRIDE 4 MG PO TABS: 4.0000 mg | ORAL_TABLET | Freq: Every day | ORAL | 1 refills | Status: AC

## 2018-07-29 MED ORDER — METFORMIN HCL ER 500 MG PO TB24: 2000.0000 mg | ORAL_TABLET | Freq: Every day | ORAL | 1 refills | Status: AC

## 2018-07-29 MED ORDER — EMPAGLIFLOZIN 25 MG PO TABS: 25.0000 mg | ORAL_TABLET | Freq: Every day | ORAL | 1 refills | Status: AC

## 2018-07-30 MED FILL — JARDIANCE 25 MG TABLET: 25 | 90 days supply | Qty: 90 | Fill #0

## 2018-07-30 MED FILL — GLIMEPIRIDE 4 MG TABLET: 4 | 90 days supply | Qty: 90 | Fill #0

## 2018-07-30 MED FILL — metFORMIN HCL ER 500 MG TB2: 500 | 90 days supply | Qty: 360 | Fill #0

## 2018-08-03 ENCOUNTER — Encounter: Payer: Self-pay | Admitting: Internal Medicine

## 2018-08-05 ENCOUNTER — Telehealth: Payer: Self-pay

## 2018-08-05 NOTE — Telephone Encounter (Signed)
Prior authorization for Ozempic has been approved by patient's insurance.  Coverage is effective 08/04/2018 to 08/04/2019  Approval letter has been sent to scanning.

## 2018-09-01 MED FILL — RAMIPRIL 5 MG CAPS: 5 | 90 days supply | Qty: 90 | Fill #0

## 2018-09-17 ENCOUNTER — Encounter: Payer: Self-pay | Admitting: Internal Medicine

## 2018-09-27 ENCOUNTER — Encounter: Payer: Self-pay | Admitting: Internal Medicine

## 2018-10-12 MED FILL — PIOGLITAZONE HCL 30 MG TAB: 30 | 90 days supply | Qty: 90 | Fill #0

## 2018-10-12 MED FILL — GLIMEPIRIDE 4 MG TABLET: 4 | 90 days supply | Qty: 180 | Fill #0

## 2018-10-12 MED FILL — RAMIPRIL 5 MG CAPS: 5 | 90 days supply | Qty: 90 | Fill #0

## 2018-10-12 MED FILL — metFORMIN HCL ER 500 MG TB2: 500 | 90 days supply | Qty: 360 | Fill #0

## 2018-10-12 MED FILL — JARDIANCE 25 MG TABLET: 25 | 90 days supply | Qty: 90 | Fill #0

## 2018-10-12 MED FILL — SIMVASTATIN 10 MG TABLET: 10 | 90 days supply | Qty: 90 | Fill #0

## 2019-01-06 MED FILL — KETOCONAZOLE CR/HC 1% CR: 2%/1% | 7 days supply | Qty: 30 | Fill #0

## 2019-01-08 ENCOUNTER — Other Ambulatory Visit: Payer: Self-pay

## 2019-01-08 ENCOUNTER — Ambulatory Visit
Admission: EM | Admit: 2019-01-08 | Discharge: 2019-01-08 | Disposition: A | Discharge status: Home / Self Care | Payer: BC Managed Care – PPO | Attending: Emergency Medicine | Admitting: Emergency Medicine

## 2019-01-08 ENCOUNTER — Encounter: Payer: Self-pay | Admitting: Emergency Medicine

## 2019-01-08 DIAGNOSIS — S61452A Open bite of left hand, initial encounter: Secondary | ICD-10-CM | POA: Diagnosis not present

## 2019-01-08 DIAGNOSIS — W5501XA Bitten by cat, initial encounter: Secondary | ICD-10-CM

## 2019-01-08 DIAGNOSIS — L089 Local infection of the skin and subcutaneous tissue, unspecified: Secondary | ICD-10-CM | POA: Diagnosis not present

## 2019-01-08 MED ORDER — AMOXICILLIN-POT CLAVULANATE 875-125 MG PO TABS: 1.0000 | ORAL_TABLET | Freq: Two times a day (BID) | ORAL | 0 refills | Status: AC

## 2019-01-08 NOTE — Discharge Instructions (Addendum)
Take antibiotic twice daily as prescribed with food. Follow-up with cat's vet regarding immunization status/further recommendations. Please have low threshold to go to ER for rabies vaccine/treatment.

## 2019-01-08 NOTE — ED Provider Notes (Signed)
[facility]    CSN: [id number] Arrival date & time: 01/08/19  1352      History   Chief Complaint Chief Complaint  Patient presents with  . Animal Bite    HPI Keith Trevino is a 53 y.o. male with history of hypertension, diabetes presenting for multiple hand scratch/bite marks after being bit by his neighbors domestic, indoor cat yesterday.  Last rabies vaccine per owner was 2013.  Largely concerned about left base of thumb swelling, streaking, pain.  Has slightly decreased range of motion second to pain.  Was able to wash hands after incident and applied Dermabond to superficial lesions.  Denies discharge, numbness, weakness.   Past Medical History:  Diagnosis Date  . Diabetes mellitus without complication (HCC)   . Hypertension     Patient Active Problem List   Diagnosis Date Noted  . Overweight (BMI 25.0-29.9) 10/20/2017  . Hyperlipidemia 07/20/2017  . Type 2 diabetes mellitus with hyperglycemia, without long-term current use of insulin (HCC) 05/12/2016    Past Surgical History:  Procedure Laterality Date  . COLONOSCOPY WITH PROPOFOL N/A 09/15/2016   Procedure: COLONOSCOPY WITH PROPOFOL;  Surgeon: Charolett BumpersJohnson, Martin K, MD;  Location: [facility];  Service: Endoscopy;  Laterality: N/A;       Home Medications    Prior to Admission medications   Medication Sig Start Date End Date Taking? Authorizing Provider  ACCU-CHEK FASTCLIX LANCETS MISC Use to check sugars once a day 05/15/17   Carlus PavlovGherghe, Cristina, MD  amoxicillin-clavulanate (AUGMENTIN) 875-125 MG tablet Take 1 tablet by mouth every 12 (twelve) hours. 01/08/19   Hall-Potvin, [place], PA-C  aspirin 81 MG tablet Take 81 mg by mouth daily.    [provider]  Dulaglutide (TRULICITY) 0.75 MG/0.5ML SOPN Inject weekly 03/10/17   Carlus PavlovGherghe, Cristina, MD  empagliflozin (JARDIANCE) 25 MG TABS tablet Take 25 mg by mouth daily. 07/29/18   Carlus PavlovGherghe, Cristina, MD  fluticasone (FLONASE) 50 MCG/ACT nasal spray  Place 1 spray into both nostrils daily as needed for allergies.     [provider]  glimepiride (AMARYL) 4 MG tablet Take 1 tablet (4 mg total) by mouth daily. 07/29/18   Carlus PavlovGherghe, Cristina, MD  glucose blood (ACCU-CHEK GUIDE) test strip Use to check sugars once daily 05/15/17   Carlus PavlovGherghe, Cristina, MD  ibuprofen (ADVIL,MOTRIN) 200 MG tablet Take 400 mg by mouth every 6 (six) hours as needed (two tablets). Two tablets     [provider]  levocetirizine (XYZAL) 5 MG tablet Take 1 tablet (5 mg total) by mouth every evening. 02/15/16   Bing NeighborsHarris, Kimberly S, FNP  metFORMIN (GLUCOPHAGE-XR) 500 MG 24 hr tablet Take 4 tablets (2,000 mg total) by mouth daily. 07/29/18   Carlus PavlovGherghe, Cristina, MD  Multiple Vitamin (MULTIVITAMIN) tablet Take 1 tablet by mouth daily.    [provider]  olopatadine (PATANOL) 0.1 % ophthalmic solution Place 1-2 drops into both eyes 2 (two) times daily as needed for allergies.  09/04/16   [provider]  ramipril (ALTACE) 5 MG capsule Take 5 mg by mouth daily.     [provider]  Semaglutide,0.25 or 0.5MG /DOS, (OZEMPIC, 0.25 OR 0.5 MG/DOSE,) 2 MG/1.5ML SOPN Inject 0.5 mg into the skin once a week. 07/29/18   Carlus PavlovGherghe, Cristina, MD  simvastatin (ZOCOR) 10 MG tablet Take 10 mg by mouth every evening.     [provider]    Family History Family History  Problem Relation Age of Onset  . Diabetes Other   . Hypertension  Other   . Stroke Other   . Heart attack Other   . Diabetes Mother   . Kidney disease Mother   . Cancer Father     Social History Social History   Tobacco Use  . Smoking status: Never Smoker  . Smokeless tobacco: Never Used  Substance Use Topics  . Alcohol use: No    Alcohol/week: 0.0 standard drinks  . Drug use: No     Allergies   Actos [pioglitazone], Bydureon [exenatide], Trulicity [dulaglutide], and Victoza [liraglutide]   Review of Systems Review of Systems  Constitutional: Negative for activity  change, appetite change, fatigue and fever.  Respiratory: Negative for cough and shortness of breath.   Cardiovascular: Negative for chest pain and palpitations.  Gastrointestinal: Negative for abdominal pain, diarrhea and vomiting.  Musculoskeletal: Negative for arthralgias and myalgias.  Skin: Positive for wound. Negative for rash.  Neurological: Negative for dizziness, speech difficulty, weakness and headaches.  All other systems reviewed and are negative.    Physical Exam Triage Vital Signs ED Triage Vitals  Enc Vitals Group     BP 01/08/19 1410 130/72     Pulse Rate 01/08/19 1410 80     Resp 01/08/19 1410 16     Temp 01/08/19 1423 98.2 F (36.8 C)     Temp Source 01/08/19 1423 Oral     SpO2 01/08/19 1410 98 %     Weight --      Height --      Head Circumference --      Peak Flow --      Pain Score 01/08/19 1410 4     Pain Loc --      Pain Edu? --      Excl. in [place]? --    No data found.  Updated Vital Signs BP 130/72 (BP Location: Right Arm)   Pulse 80   Temp 98.2 F (36.8 C) (Oral)   Resp 16   SpO2 98%   Visual Acuity Right Eye Distance:   Left Eye Distance:   Bilateral Distance:    Right Eye Near:   Left Eye Near:    Bilateral Near:     Physical Exam Constitutional:      General: He is not in acute distress. HENT:     Head: Normocephalic and atraumatic.  Eyes:     General: No scleral icterus.    Pupils: Pupils are equal, round, and reactive to light.  Cardiovascular:     Rate and Rhythm: Normal rate and regular rhythm.     Heart sounds: No murmur. No gallop.   Pulmonary:     Effort: Pulmonary effort is normal. No respiratory distress.     Breath sounds: No wheezing.  Skin:    General: Skin is warm.     Capillary Refill: Capillary refill takes less than 2 seconds.     Coloration: Skin is not jaundiced or pale.     Comments: Scattered, superficial scratches, most covered with Dermabond without fluctuance, induration internally.  Patient's left  MCP with moderate edema, tenderness to palpation.  Mildly decreased active ROM second to "tightness ".  Grip strength 5/5 bilaterally and symmetric, the patient endorses pain in left thumb.  Incision intact, radial pulses 2+ bilaterally/symmetric.  Slight streaking up forearm from left thumb: ~6 cm in length  Neurological:     General: No focal deficit present.     Mental Status: He is alert and oriented to person, place, and time.  UC Treatments / Results  Labs (all labs ordered are listed, but only abnormal results are displayed) Labs Reviewed - No data to display  EKG   Radiology No results found.  Procedures Procedures (including critical care time)  Medications Ordered in UC Medications - No data to display  Initial Impression / Assessment and Plan / UC Course  I have reviewed the triage vital signs and the nursing notes.  Pertinent labs & imaging results that were available during my care of the patient were reviewed by me and considered in my medical decision making (see chart for details).     1.  Infection of left hand with infection This provider recommended ER for further evaluation/initial rabies treatment given cat reportedly being overdue for rabies vaccine.  Patient declines, will follow-up with veterinarian instead.  Will initiate Augmentin for secondary infection, monitor closely for progression of infection.  Provided patient with Up-to-Date patient information regarding rabies transmission and symptomatology.  Return precautions discussed, patient verbalized understanding and is agreeable to plan. Final Clinical Impressions(s) / UC Diagnoses   Final diagnoses:  Cat bite of left hand with infection, initial encounter     Discharge Instructions     Take antibiotic twice daily as prescribed with food. Follow-up with cat's vet regarding immunization status/further recommendations. Please have low threshold to go to ER for rabies vaccine/treatment.     ED Prescriptions    Medication Sig Dispense Auth. Provider   amoxicillin-clavulanate (AUGMENTIN) 875-125 MG tablet Take 1 tablet by mouth every 12 (twelve) hours. 14 tablet Hall-Potvin, [place], PA-C     PDMP not reviewed this encounter.   Hall-Potvin, [place], [place] 01/09/19 1402

## 2019-01-08 NOTE — ED Triage Notes (Signed)
Per pt he was helping a move yesterday and the cat attacked his hands. Pt hand scratch and bite marks on both hands. Left has a streak of red going up his arm. Right has some swelling. Cat is an inside cat and last rabies vaccination was 2013.

## 2019-01-09 ENCOUNTER — Encounter: Payer: Self-pay | Admitting: Emergency Medicine

## 2019-01-09 ENCOUNTER — Telehealth: Payer: Self-pay

## 2019-01-09 NOTE — Telephone Encounter (Signed)
Checked in on patient, discussed medications, and encouraged return call with any continuing questions or concerns.    Bridney Guadarrama A Siriyah Ambrosius, RN  

## 2019-01-12 MED FILL — SIMVASTATIN 10 MG TABLET: 10 | 90 days supply | Qty: 90 | Fill #0

## 2019-01-12 MED FILL — RAMIPRIL 5 MG CAPS: 5 | 90 days supply | Qty: 90 | Fill #0

## 2019-01-12 MED FILL — PIOGLITAZONE HCL 30 MG TAB: 30 | 90 days supply | Qty: 90 | Fill #0

## 2019-01-12 MED FILL — metFORMIN HCL ER 500 MG TB2: 500 | 90 days supply | Qty: 360 | Fill #0

## 2019-01-12 MED FILL — JARDIANCE 25 MG TABLET: 25 | 90 days supply | Qty: 90 | Fill #0

## 2019-01-12 MED FILL — GLIMEPIRIDE 4 MG TABLET: 4 | 90 days supply | Qty: 180 | Fill #0

## 2019-01-21 MED FILL — KETOCONAZOLE CR/HC 1% CR: 2%/1% | 7 days supply | Qty: 30 | Fill #0

## 2019-01-27 MED FILL — FREESTYLE LIBRE 14 DAY SENS: 28 days supply | Qty: 2 | Fill #0

## 2019-02-18 MED FILL — FREESTYLE LIBRE 14 DAY SENS: 84 days supply | Qty: 6 | Fill #1

## 2019-03-03 MED FILL — FREESTYLE LIBRE 14 DAY SENS: 28 days supply | Qty: 2 | Fill #1

## 2019-04-14 MED FILL — FREESTYLE LIBRE 14 DAY SENS: 84 days supply | Qty: 6 | Fill #0

## 2019-04-22 MED FILL — SIMVASTATIN 10 MG TABLET: 10 | 90 days supply | Qty: 90 | Fill #1

## 2019-04-22 MED FILL — PIOGLITAZONE HCL 30 MG TAB: 30 | 90 days supply | Qty: 90 | Fill #1

## 2019-04-29 MED FILL — SIMVASTATIN 10 MG TABLET: 10 | 90 days supply | Qty: 90 | Fill #1

## 2020-04-06 ENCOUNTER — Emergency Department (HOSPITAL_BASED_OUTPATIENT_CLINIC_OR_DEPARTMENT_OTHER)
Admission: EM | Admit: 2020-04-06 | Discharge: 2020-04-06 | Disposition: A | Discharge status: Home / Self Care | Payer: BC Managed Care – PPO | Attending: Emergency Medicine | Admitting: Emergency Medicine

## 2020-04-06 ENCOUNTER — Other Ambulatory Visit: Payer: Self-pay

## 2020-04-06 ENCOUNTER — Encounter (HOSPITAL_BASED_OUTPATIENT_CLINIC_OR_DEPARTMENT_OTHER): Payer: Self-pay | Admitting: Emergency Medicine

## 2020-04-06 ENCOUNTER — Emergency Department (HOSPITAL_BASED_OUTPATIENT_CLINIC_OR_DEPARTMENT_OTHER): Payer: BC Managed Care – PPO

## 2020-04-06 ENCOUNTER — Telehealth (HOSPITAL_BASED_OUTPATIENT_CLINIC_OR_DEPARTMENT_OTHER): Payer: Self-pay | Admitting: Emergency Medicine

## 2020-04-06 DIAGNOSIS — I10 Essential (primary) hypertension: Secondary | ICD-10-CM | POA: Insufficient documentation

## 2020-04-06 DIAGNOSIS — Z7984 Long term (current) use of oral hypoglycemic drugs: Secondary | ICD-10-CM | POA: Diagnosis not present

## 2020-04-06 DIAGNOSIS — Z79899 Other long term (current) drug therapy: Secondary | ICD-10-CM | POA: Diagnosis not present

## 2020-04-06 DIAGNOSIS — E1165 Type 2 diabetes mellitus with hyperglycemia: Secondary | ICD-10-CM | POA: Insufficient documentation

## 2020-04-06 DIAGNOSIS — N200 Calculus of kidney: Secondary | ICD-10-CM | POA: Insufficient documentation

## 2020-04-06 DIAGNOSIS — R109 Unspecified abdominal pain: Secondary | ICD-10-CM | POA: Diagnosis present

## 2020-04-06 LAB — CBC WITH DIFFERENTIAL/PLATELET
Abs Immature Granulocytes: 0.03 10*3/uL (ref 0.00–0.07)
Basophils Absolute: 0 10*3/uL (ref 0.0–0.1)
Basophils Relative: 0 %
Eosinophils Absolute: 0 10*3/uL (ref 0.0–0.5)
Eosinophils Relative: 0 %
HCT: 38.3 % — ABNORMAL LOW (ref 39.0–52.0)
Hemoglobin: 13 g/dL (ref 13.0–17.0)
Immature Granulocytes: 0 %
Lymphocytes Relative: 9 %
Lymphs Abs: 0.7 10*3/uL (ref 0.7–4.0)
MCH: 31.8 pg (ref 26.0–34.0)
MCHC: 33.9 g/dL (ref 30.0–36.0)
MCV: 93.6 fL (ref 80.0–100.0)
Monocytes Absolute: 0.5 10*3/uL (ref 0.1–1.0)
Monocytes Relative: 6 %
Neutro Abs: 7 10*3/uL (ref 1.7–7.7)
Neutrophils Relative %: 85 %
Platelets: 230 10*3/uL (ref 150–400)
RBC: 4.09 MIL/uL — ABNORMAL LOW (ref 4.22–5.81)
RDW: 14 % (ref 11.5–15.5)
WBC: 8.4 10*3/uL (ref 4.0–10.5)
nRBC: 0 % (ref 0.0–0.2)

## 2020-04-06 LAB — COMPREHENSIVE METABOLIC PANEL
ALT: 89 U/L — ABNORMAL HIGH (ref 0–44)
AST: 65 U/L — ABNORMAL HIGH (ref 15–41)
Albumin: 4 g/dL (ref 3.5–5.0)
Alkaline Phosphatase: 59 U/L (ref 38–126)
Anion gap: 9 (ref 5–15)
BUN: 25 mg/dL — ABNORMAL HIGH (ref 6–20)
CO2: 22 mmol/L (ref 22–32)
Calcium: 9.3 mg/dL (ref 8.9–10.3)
Chloride: 103 mmol/L (ref 98–111)
Creatinine, Ser: 1.34 mg/dL — ABNORMAL HIGH (ref 0.61–1.24)
GFR, Estimated: >60 mL/min (ref >60)
Glucose, Bld: 236 mg/dL — ABNORMAL HIGH (ref 70–99)
Potassium: 4.1 mmol/L (ref 3.5–5.1)
Sodium: 134 mmol/L — ABNORMAL LOW (ref 135–145)
Total Bilirubin: 0.7 mg/dL (ref 0.3–1.2)
Total Protein: 7.1 g/dL (ref 6.5–8.1)

## 2020-04-06 LAB — URINALYSIS, ROUTINE W REFLEX MICROSCOPIC
Bilirubin Urine: NEGATIVE
Glucose, UA: >=500 mg/dL — AB
Hgb urine dipstick: NEGATIVE
Leukocytes,Ua: NEGATIVE
Nitrite: NEGATIVE
Protein, ur: NEGATIVE mg/dL
Specific Gravity, Urine: 1.02 (ref 1.005–1.030)
pH: 5 (ref 5.0–8.0)

## 2020-04-06 LAB — URINALYSIS, MICROSCOPIC (REFLEX)
RBC / HPF: NONE SEEN RBC/hpf (ref 0–5)
Squamous Epithelial / LPF: NONE SEEN (ref 0–5)

## 2020-04-06 LAB — LIPASE, BLOOD: Lipase: 24 U/L (ref 11–51)

## 2020-04-06 MED ORDER — SODIUM CHLORIDE 0.9 % IV BOLUS
1000.0000 mL | Freq: Once | INTRAVENOUS | Status: AC
  Administered 2020-04-06: 16:00:00 1000 mL via INTRAVENOUS

## 2020-04-06 MED ORDER — ONDANSETRON 4 MG PO TBDP: 4.0000 mg | ORAL_TABLET | Freq: Three times a day (TID) | ORAL | 0 refills | Status: DC | PRN

## 2020-04-06 MED ORDER — MORPHINE SULFATE (PF) 4 MG/ML IV SOLN
4.0000 mg | Freq: Once | INTRAVENOUS | Status: AC
  Administered 2020-04-06: 16:00:00 4 mg via INTRAVENOUS
  Filled 2020-04-06: qty 1

## 2020-04-06 MED ORDER — ONDANSETRON 4 MG PO TBDP: 4.0000 mg | ORAL_TABLET | Freq: Three times a day (TID) | ORAL | 0 refills | Status: AC | PRN

## 2020-04-06 MED ORDER — MORPHINE SULFATE (PF) 4 MG/ML IV SOLN
4.0000 mg | Freq: Once | INTRAVENOUS | Status: AC
  Administered 2020-04-06: 17:00:00 4 mg via INTRAVENOUS
  Filled 2020-04-06: qty 1

## 2020-04-06 MED ORDER — SODIUM CHLORIDE 0.9 % IV BOLUS
1000.0000 mL | Freq: Once | INTRAVENOUS | Status: AC
  Administered 2020-04-06: 17:00:00 1000 mL via INTRAVENOUS

## 2020-04-06 MED ORDER — ONDANSETRON HCL 4 MG/2ML IJ SOLN
4.0000 mg | Freq: Once | INTRAMUSCULAR | Status: AC
  Administered 2020-04-06: 16:00:00 4 mg via INTRAVENOUS
  Filled 2020-04-06: qty 2

## 2020-04-06 NOTE — ED Provider Notes (Signed)
[facility] EMERGENCY DEPARTMENT Provider Note   CSN: [id number] Arrival date & time: 04/06/20  1326     History Chief Complaint  Patient presents with  . Flank Pain    Keith Trevino is a 54 y.o. with past medical history of diabetes and hypertension that presents the emergency department today for right flank pain that started in the middle of the night last night.  Patient states that he woke up around 2 AM with some nausea and vomiting, then started having sharp shooting left flank pain that radiated down into his side.  States that the pain was constant but is now intermittent.  Tried to take an Tylenol for this without much relief.  Patient states that he does have history of kidney stones, multiple years ago which passed without intervention.  Patient states that it does feel similar.  No previous surgery to abdomen.  No scrotal pain, penile discharge, penile pain or testicular pain.  No dysuria or hematuria.  States that he is not had an appetite today, has not eaten anything.  Denies any vomiting this morning, does admit to slight nausea currently.  No trauma to the area.  Has not been lifting anything heavy, no strenuous exercise.  States that he is generally healthy.  No sick contacts.  Denies any fevers, chills, chest pain, shortness of breath.  Denies any true abdominal pain.  No other complaints.Marland Kitchen  HPI     Past Medical History:  Diagnosis Date  . Diabetes mellitus without complication (HCC)   . Hypertension     Patient Active Problem List   Diagnosis Date Noted  . Overweight (BMI 25.0-29.9) 10/20/2017  . Hyperlipidemia 07/20/2017  . Type 2 diabetes mellitus with hyperglycemia, without long-term current use of insulin (HCC) 05/12/2016    Past Surgical History:  Procedure Laterality Date  . COLONOSCOPY WITH PROPOFOL N/A 09/15/2016   Procedure: COLONOSCOPY WITH PROPOFOL;  Surgeon: Charolett Bumpers, MD;  Location: [facility];  Service: Endoscopy;   Laterality: N/A;       Family History  Problem Relation Age of Onset  . Diabetes Other   . Hypertension Other   . Stroke Other   . Heart attack Other   . Diabetes Mother   . Kidney disease Mother   . Cancer Father     Social History   Tobacco Use  . Smoking status: Never Smoker  . Smokeless tobacco: Never Used  Vaping Use  . Vaping Use: Never used  Substance Use Topics  . Alcohol use: No    Alcohol/week: 0.0 standard drinks  . Drug use: No    Home Medications Prior to Admission medications   Medication Sig Start Date End Date Taking? Authorizing Provider  ACCU-CHEK FASTCLIX LANCETS MISC Use to check sugars once a day 05/15/17   Carlus Pavlov, MD  amoxicillin-clavulanate (AUGMENTIN) 875-125 MG tablet Take 1 tablet by mouth every 12 (twelve) hours. 01/08/19   Hall-Potvin, [place], PA-C  aspirin 81 MG tablet Take 81 mg by mouth daily.    [provider]  Dulaglutide (TRULICITY) 0.75 MG/0.5ML SOPN Inject weekly 03/10/17   Carlus Pavlov, MD  empagliflozin (JARDIANCE) 25 MG TABS tablet Take 25 mg by mouth daily. 07/29/18   Carlus Pavlov, MD  fluticasone (FLONASE) 50 MCG/ACT nasal spray Place 1 spray into both nostrils daily as needed for allergies.     [provider]  glimepiride (AMARYL) 4 MG tablet Take 1 tablet (4 mg total) by mouth daily. 07/29/18   Gherghe,  Silvestre Mesi, MD  glucose blood (ACCU-CHEK GUIDE) test strip Use to check sugars once daily 05/15/17   Carlus Pavlov, MD  ibuprofen (ADVIL,MOTRIN) 200 MG tablet Take 400 mg by mouth every 6 (six) hours as needed (two tablets). Two tablets     [provider]  levocetirizine (XYZAL) 5 MG tablet Take 1 tablet (5 mg total) by mouth every evening. 02/15/16   Bing Neighbors, FNP  metFORMIN (GLUCOPHAGE-XR) 500 MG 24 hr tablet Take 4 tablets (2,000 mg total) by mouth daily. 07/29/18   Carlus Pavlov, MD  Multiple Vitamin (MULTIVITAMIN) tablet Take 1 tablet by mouth daily.    [provider]  olopatadine (PATANOL) 0.1 % ophthalmic solution Place 1-2 drops into both eyes 2 (two) times daily as needed for allergies.  09/04/16   [provider]  ondansetron (ZOFRAN ODT) 4 MG disintegrating tablet Take 1 tablet (4 mg total) by mouth every 8 (eight) hours as needed for nausea or vomiting. 04/06/20   Farrel Gordon, PA-C  ramipril (ALTACE) 5 MG capsule Take 5 mg by mouth daily.     [provider]  Semaglutide,0.25 or 0.5MG /DOS, (OZEMPIC, 0.25 OR 0.5 MG/DOSE,) 2 MG/1.5ML SOPN Inject 0.5 mg into the skin once a week. 07/29/18   Carlus Pavlov, MD  simvastatin (ZOCOR) 10 MG tablet Take 10 mg by mouth every evening.     [provider]    Allergies    Actos [pioglitazone], Bydureon [exenatide], Trulicity [dulaglutide], and Victoza [liraglutide]  Review of Systems   Review of Systems  Constitutional: Negative for chills, diaphoresis, fatigue and fever.  HENT: Negative for congestion, sore throat and trouble swallowing.   Eyes: Negative for pain and visual disturbance.  Respiratory: Negative for cough, shortness of breath and wheezing.   Cardiovascular: Negative for chest pain, palpitations and leg swelling.  Gastrointestinal: Positive for nausea and vomiting. Negative for abdominal distention, abdominal pain and diarrhea.  Genitourinary: Positive for flank pain. Negative for decreased urine volume, difficulty urinating, dysuria, enuresis, frequency, genital sores, hematuria, penile discharge, penile pain, penile swelling, scrotal swelling, testicular pain and urgency.  Musculoskeletal: Negative for back pain, neck pain and neck stiffness.  Skin: Negative for pallor.  Neurological: Negative for dizziness, speech difficulty, weakness and headaches.  Psychiatric/Behavioral: Negative for confusion.    Physical Exam Updated Vital Signs BP 134/81 (BP Location: Left Arm)   Pulse 70   Temp 98.7 F (37.1 C) (Oral)   Resp 16   Ht 5\' 9"  (1.753 m)    Wt 95.3 kg   SpO2 100%   BMI 31.01 kg/m   Physical Exam Constitutional:      General: He is not in acute distress.    Appearance: Normal appearance. He is not ill-appearing, toxic-appearing or diaphoretic.  HENT:     Mouth/Throat:     Mouth: Mucous membranes are moist.     Pharynx: Oropharynx is clear.  Eyes:     General: No scleral icterus.    Extraocular Movements: Extraocular movements intact.     Pupils: Pupils are equal, round, and reactive to light.  Cardiovascular:     Rate and Rhythm: Normal rate and regular rhythm.     Pulses: Normal pulses.     Heart sounds: Normal heart sounds.  Pulmonary:     Effort: Pulmonary effort is normal. No respiratory distress.     Breath sounds: Normal breath sounds. No stridor. No wheezing, rhonchi or rales.  Chest:     Chest wall: No tenderness.  Abdominal:  General: Abdomen is flat. There is no distension.     Palpations: Abdomen is soft.     Tenderness: There is no abdominal tenderness. There is no guarding or rebound.  Musculoskeletal:        General: No swelling or tenderness. Normal range of motion.     Cervical back: Normal range of motion and neck supple. No rigidity.       Back:     Right lower leg: No edema.     Left lower leg: No edema.     Comments: Left flank pain that radiates down into his left side.  No erythema or rashes noted.  No ecchymosis.  Patient is able to range back, no midline tenderness.  Skin:    General: Skin is warm and dry.     Capillary Refill: Capillary refill takes less than 2 seconds.     Coloration: Skin is not pale.  Neurological:     General: No focal deficit present.     Mental Status: He is alert and oriented to person, place, and time.  Psychiatric:        Mood and Affect: Mood normal.        Behavior: Behavior normal.     ED Results / Procedures / Treatments   Labs (all labs ordered are listed, but only abnormal results are displayed) Labs Reviewed  CBC WITH DIFFERENTIAL/PLATELET  - Abnormal; Notable for the following components:      Result Value   RBC 4.09 (*)    HCT 38.3 (*)    All other components within normal limits  COMPREHENSIVE METABOLIC PANEL - Abnormal; Notable for the following components:   Sodium 134 (*)    Glucose, Bld 236 (*)    BUN 25 (*)    Creatinine, Ser 1.34 (*)    AST 65 (*)    ALT 89 (*)    All other components within normal limits  URINALYSIS, ROUTINE W REFLEX MICROSCOPIC - Abnormal; Notable for the following components:   Glucose, UA >=500 (*)    Ketones, ur TRACE (*)    All other components within normal limits  URINALYSIS, MICROSCOPIC (REFLEX) - Abnormal; Notable for the following components:   Bacteria, UA FEW (*)    All other components within normal limits  URINE CULTURE  LIPASE, BLOOD    EKG None  Radiology CT Renal Stone Study  Result Date: 04/06/2020 CLINICAL DATA:  Flank pain.  Right-sided flank pain. EXAM: CT ABDOMEN AND PELVIS WITHOUT CONTRAST TECHNIQUE: Multidetector CT imaging of the abdomen and pelvis was performed following the standard protocol without IV contrast. COMPARISON:  None. FINDINGS: Lower chest: The lung bases are clear. The heart size is normal. Hepatobiliary: The liver is normal. Normal gallbladder.There is no biliary ductal dilation. Pancreas: Normal contours without ductal dilatation. No peripancreatic fluid collection. Spleen: Unremarkable. Adrenals/Urinary Tract: --Adrenal glands: Unremarkable. --Right kidney/ureter: There is mild right-sided hydroureteronephrosis secondary to an obstructing 4-5 mm stone in the mid right ureter (axial series 2, image 52). There are no additional stones remaining in the right kidney. --Left kidney/ureter: No hydronephrosis or radiopaque kidney stones. --Urinary bladder: Unremarkable. Stomach/Bowel: --Stomach/Duodenum: No hiatal hernia or other gastric abnormality. Normal duodenal course and caliber. --Small bowel: Unremarkable. --Colon: Unremarkable. --Appendix: Normal.  Vascular/Lymphatic: Atherosclerotic calcification is present within the non-aneurysmal abdominal aorta, without hemodynamically significant stenosis. --No retroperitoneal lymphadenopathy. --No mesenteric lymphadenopathy. --No pelvic or inguinal lymphadenopathy. Reproductive: Unremarkable Other: No ascites or free air. There is a fat containing left inguinal hernia. Musculoskeletal.  No acute displaced fractures. IMPRESSION: 1. Mild right-sided hydroureteronephrosis secondary to an obstructing 4-5 mm stone in the mid right ureter. 2. Fat containing left inguinal hernia. Aortic Atherosclerosis (ICD10-I70.0). Electronically Signed   By: Katherine Mantlehristopher  Green M.D.   On: 04/06/2020 16:27    Procedures Procedures (including critical care time)  Medications Ordered in ED Medications  sodium chloride 0.9 % bolus 1,000 mL ( Intravenous Stopped 04/06/20 1723)  ondansetron (ZOFRAN) injection 4 mg (4 mg Intravenous Given 04/06/20 1604)  morphine 4 MG/ML injection 4 mg (4 mg Intravenous Given 04/06/20 1606)  sodium chloride 0.9 % bolus 1,000 mL (0 mLs Intravenous Stopped 04/06/20 1829)  morphine 4 MG/ML injection 4 mg (4 mg Intravenous Given 04/06/20 1724)    ED Course  I have reviewed the triage vital signs and the nursing notes.  Pertinent labs & imaging results that were available during my care of the patient were reviewed by me and considered in my medical decision making (see chart for details).    MDM Rules/Calculators/A&P                         Olen Pelimothy J Obi is a 54 y.o. with past medical history of diabetes and hypertension that presents the emergency department today for right flank pain that started in the middle of the night last night.  Concern for nephrolithiasis at this time.  No groin pain, no fevers, vital signs normal.  Initial interventions IV fluids, morphine and Zofran.  Labs demonstrated unremarkable CBC, no leukocytosis.  CMP with glucose 236, creatinine 1.34, BUN 25.  Baseline  creatinine 1.05, however this was 2 years ago.  This could be prerenal from dehydration.  AST and ALT slightly elevated.  Did speak to patient about this, patient will have his creatinine and his liver enzymes rechecked in the next week.  CT showing 4 to 5 mm obstructing stone with hydroureteronephrosis in the mid right ureter.  Urine does not appear infected.  Upon reassessment patient states that he feels much better, pain controlled.  Patient be discharged, will follow up with urology.  Symptomatic treatment discussed.  Doubt need for further emergent work up at this time. I explained the diagnosis and have given explicit precautions to return to the ER including for any other new or worsening symptoms. The patient understands and accepts the medical plan as it's been dictated and I have answered their questions. Discharge instructions concerning home care and prescriptions have been given. The patient is STABLE and is discharged to home in good condition.   Final Clinical ression(s) / ED Diagnoses Final diagnoses:  Nephrolithiasis    Rx / DC Orders ED Discharge Orders         Ordered    ondansetron (ZOFRAN ODT) 4 MG disintegrating tablet  Every 8 hours PRN        04/06/20 1827           Farrel Gordonatel, Misk Galentine, PA-C 04/06/20 1905    Pricilla LovelessGoldston, Scott, MD 04/06/20 2142

## 2020-04-06 NOTE — ED Triage Notes (Signed)
Pt reports right sided Flank pain onset last night. Pt seen at [facility] and sent for further evaluation. Pt noted to have large glucose, positive for ketones and blood in urine.

## 2020-04-06 NOTE — Telephone Encounter (Signed)
Patient called department stating that his pharmacy is closed, requesting RX be sent to the [facility] on [address] in [facility]. Provider notified, request completed.

## 2020-04-06 NOTE — ED Notes (Signed)
Patient transported to CT 

## 2020-04-06 NOTE — Discharge Instructions (Signed)
You are seen today for kidney stone, as we discussed I want you to follow-up with your urologist in the next couple of days.  The stone is very small and should pass by itself, make sure to stay hydrated.  I also prescribed you some Zofran you can take if you start to feel nauseous, take this as directed.  Your creatinine was slightly elevated, this could be your baseline or this could be due from your dehydration and your kidney stone, either way I want you to have this rechecked in the next couple of days by your primary care, this is important.  I also want you to have your liver enzymes rechecked.  I also want you to have your glucose checked as this was slightly elevated here today 2.  Use Tylenol for your pain, stay away from ibuprofen or other NSAIDs at this time.  Use the attached instructions.  If you have any new or worsening concerning symptoms please come back to the emergency department.

## 2021-05-21 DIAGNOSIS — E782 Mixed hyperlipidemia: Secondary | ICD-10-CM | POA: Diagnosis not present

## 2021-05-21 DIAGNOSIS — Z0000 Encounter for general adult medical examination without abnormal findings: Secondary | ICD-10-CM | POA: Diagnosis not present

## 2021-05-21 DIAGNOSIS — Z125 Encounter for screening for malignant neoplasm of prostate: Secondary | ICD-10-CM | POA: Diagnosis not present

## 2021-05-21 DIAGNOSIS — Z13220 Encounter for screening for lipoid disorders: Secondary | ICD-10-CM | POA: Diagnosis not present

## 2021-05-21 DIAGNOSIS — E1169 Type 2 diabetes mellitus with other specified complication: Secondary | ICD-10-CM | POA: Diagnosis not present

## 2021-05-21 DIAGNOSIS — I10 Essential (primary) hypertension: Secondary | ICD-10-CM | POA: Diagnosis not present

## 2021-09-14 IMAGING — CT CT RENAL STONE PROTOCOL
2 of 4 series · 16 of 46 positions shown, 18 images · non-contrast
Comparison: None.

CLINICAL DATA: Flank pain.  Right-sided flank pain.

EXAM:
CT ABDOMEN AND PELVIS WITHOUT CONTRAST
TECHNIQUE: Multidetector CT imaging of the abdomen and pelvis was performed
following the standard protocol without IV contrast.

[Series 2: axial st · axial · 0.92mm/px · z∈[+372,+812]mm · 13 of 96 slices shown, 15 images]
[im 4/96  soft-tissue]
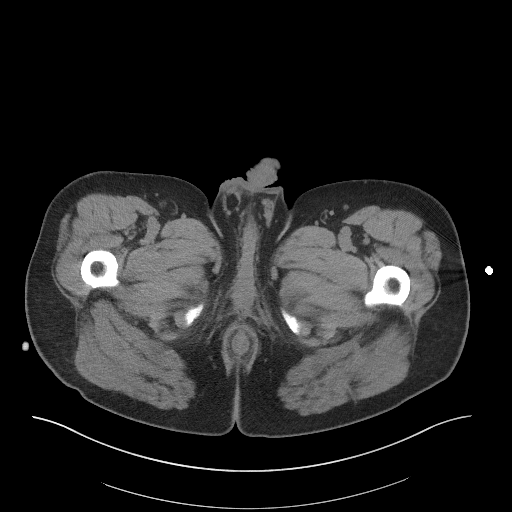
[im 4/96  bone]
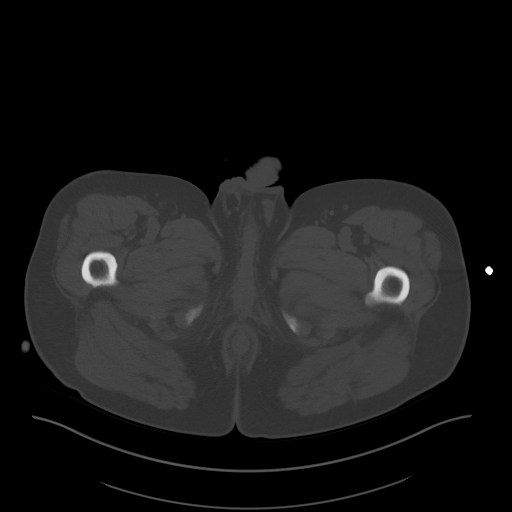
[im 12/96  soft-tissue]
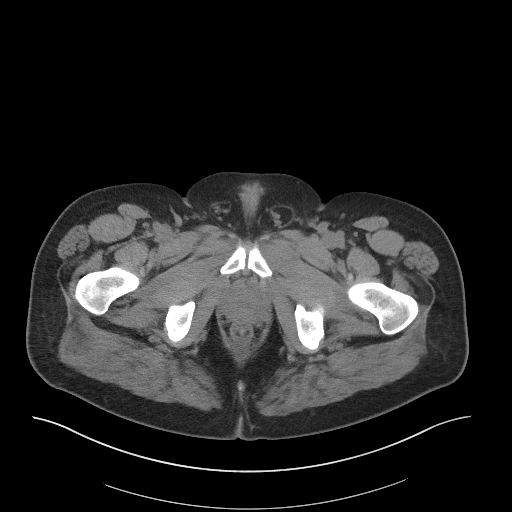
[im 20/96  soft-tissue]
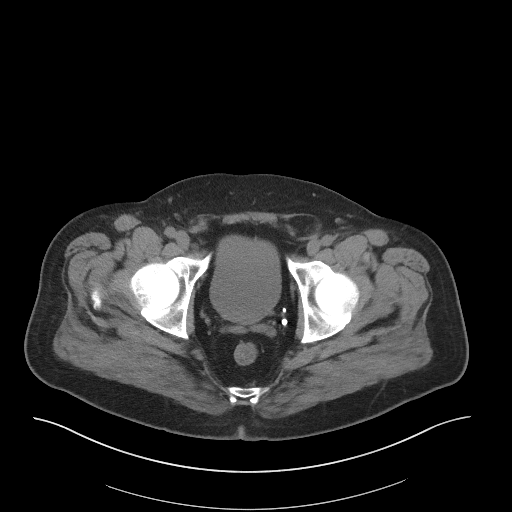
[im 28/96  soft-tissue]
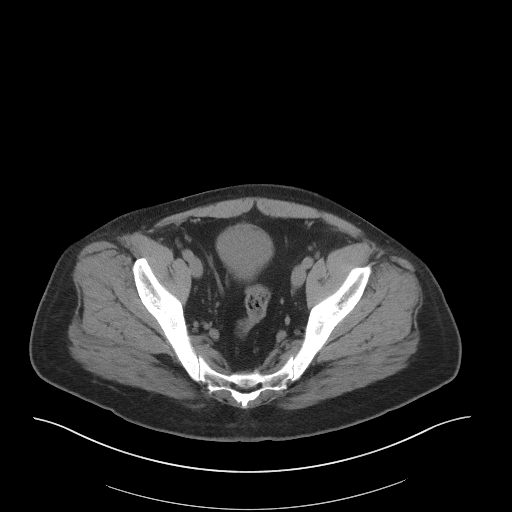
[im 32/96  soft-tissue]
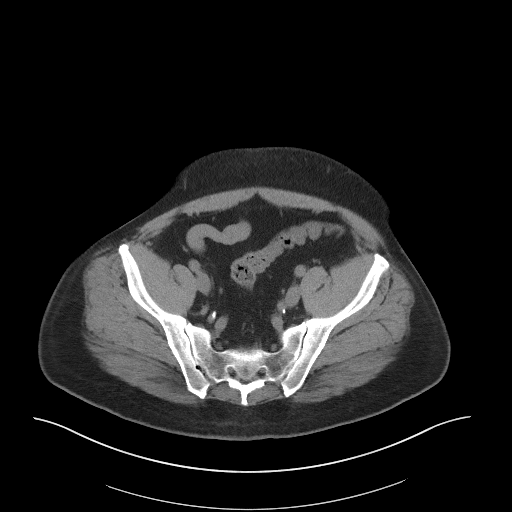
[im 40/96  soft-tissue]
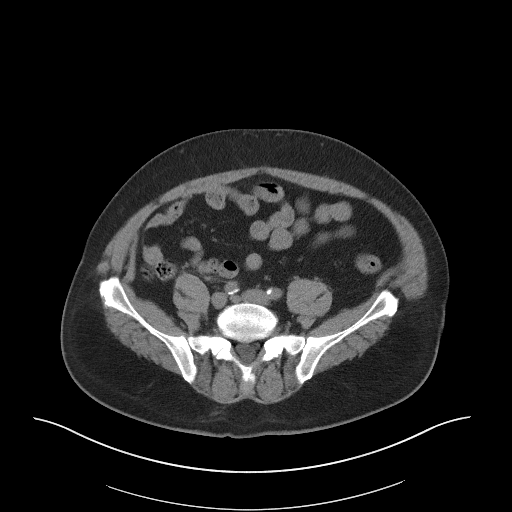
[im 48/96  soft-tissue]
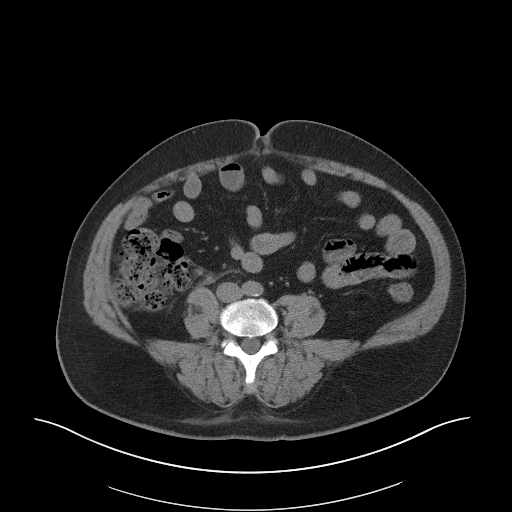
[im 56/96  soft-tissue]
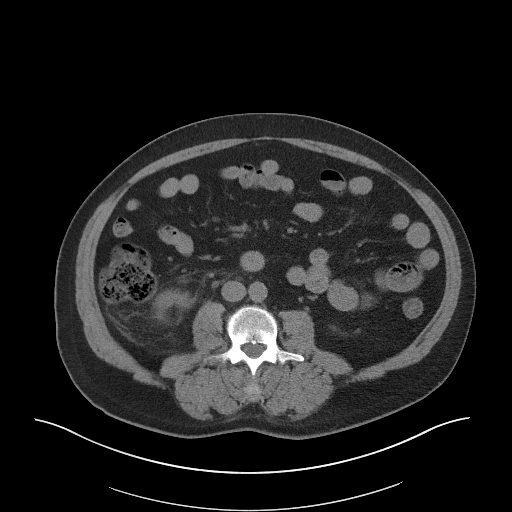
[im 64/96  soft-tissue]
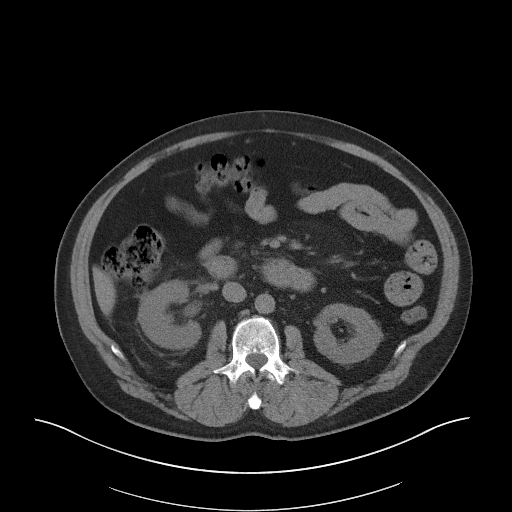
[im 64/96  bone]
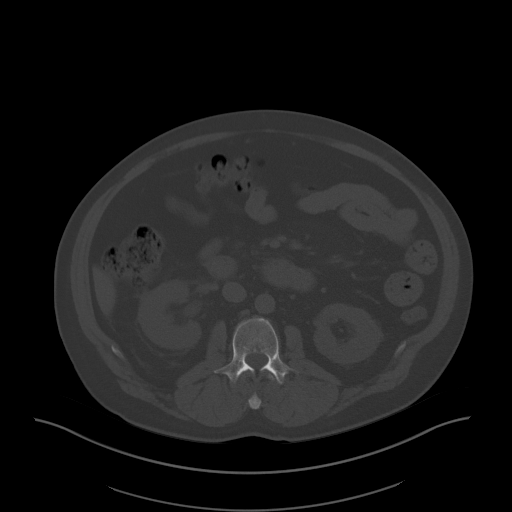
[im 68/96  soft-tissue]
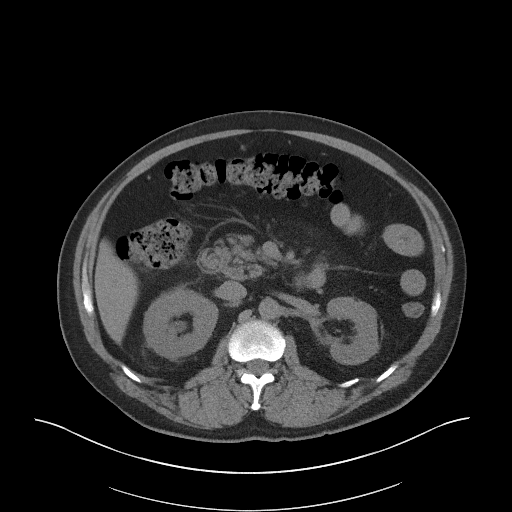
[im 76/96  soft-tissue]
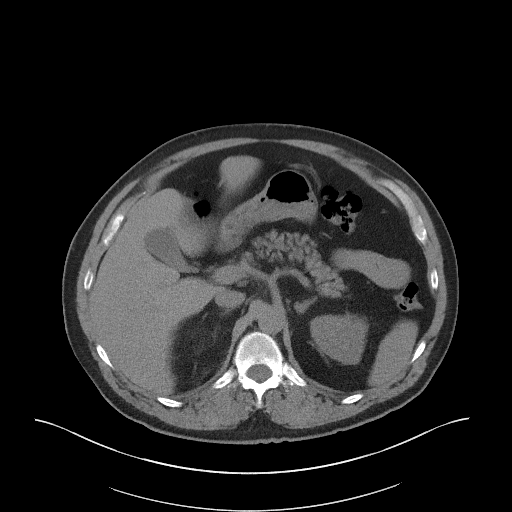
[im 84/96  soft-tissue]
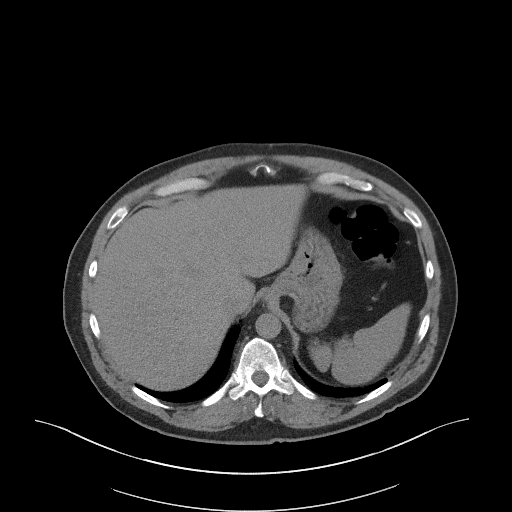
[im 92/96  soft-tissue]
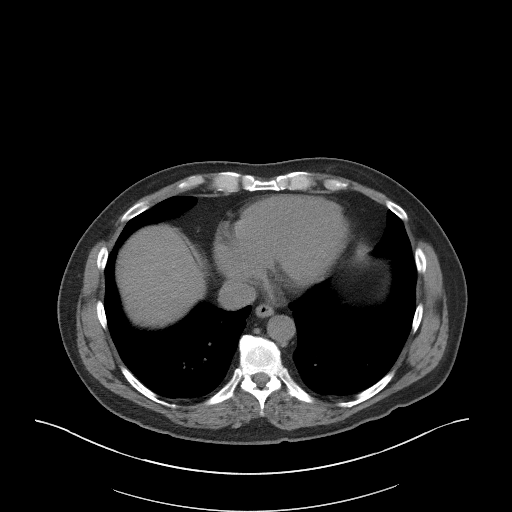

[Series 5: coronal st · coronal · 0.94mm/px · 3 of 110 slices shown]
[im 37/110  soft-tissue]
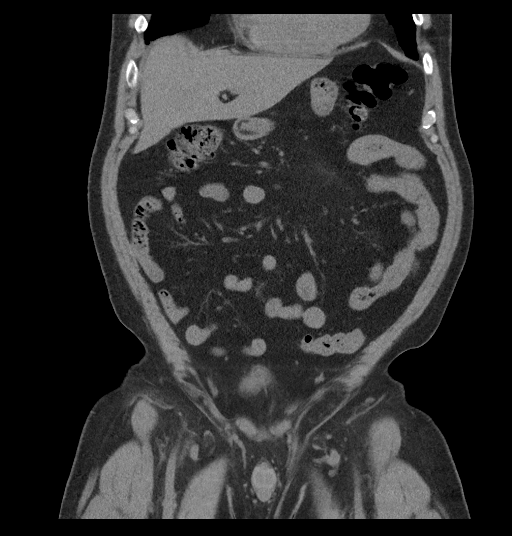
[im 49/110  soft-tissue]
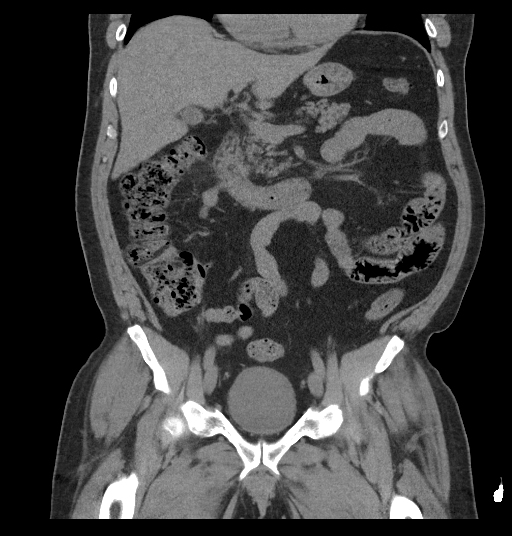
[im 61/110  soft-tissue]
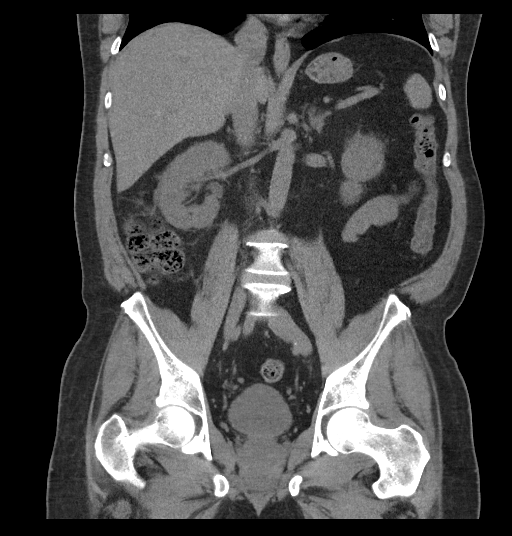

[16 of 46 positions shown; findings below may reference images not displayed]

FINDINGS: Lower chest: The lung bases are clear. The heart size is normal.

Hepatobiliary: The liver is normal. Normal gallbladder.There is no
biliary ductal dilation.

Pancreas: Normal contours without ductal dilatation. No
peripancreatic fluid collection.

Spleen: Unremarkable.

Adrenals/Urinary Tract:

--Adrenal glands: Unremarkable.

--Right kidney/ureter: There is mild right-sided
hydroureteronephrosis secondary to an obstructing 4-5 mm stone in
the mid right ureter (axial series 2, image 52). There are no
additional stones remaining in the right kidney.

--Left kidney/ureter: No hydronephrosis or radiopaque kidney stones.

--Urinary bladder: Unremarkable.

Stomach/Bowel:

--Stomach/Duodenum: No hiatal hernia or other gastric abnormality.
Normal duodenal course and caliber.

--Small bowel: Unremarkable.

--Colon: Unremarkable.

--Appendix: Normal.

Vascular/Lymphatic: Atherosclerotic calcification is present within
the non-aneurysmal abdominal aorta, without hemodynamically
significant stenosis.

--No retroperitoneal lymphadenopathy.

--No mesenteric lymphadenopathy.

--No pelvic or inguinal lymphadenopathy.

Reproductive: Unremarkable

Other: No ascites or free air. There is a fat containing left
inguinal hernia.

Musculoskeletal. No acute displaced fractures.
IMPRESSION: 1. Mild right-sided hydroureteronephrosis secondary to an
obstructing 4-5 mm stone in the mid right ureter.
2. Fat containing left inguinal hernia.

Aortic Atherosclerosis ([id number]).

## 2021-10-02 DIAGNOSIS — H903 Sensorineural hearing loss, bilateral: Secondary | ICD-10-CM | POA: Diagnosis not present

## 2021-10-02 DIAGNOSIS — H6241 Otitis externa in other diseases classified elsewhere, right ear: Secondary | ICD-10-CM | POA: Diagnosis not present

## 2021-10-02 DIAGNOSIS — B369 Superficial mycosis, unspecified: Secondary | ICD-10-CM | POA: Diagnosis not present

## 2021-10-02 DIAGNOSIS — H6121 Impacted cerumen, right ear: Secondary | ICD-10-CM | POA: Diagnosis not present

## 2021-10-24 DIAGNOSIS — H903 Sensorineural hearing loss, bilateral: Secondary | ICD-10-CM | POA: Diagnosis not present

## 2021-11-12 DIAGNOSIS — I700 Atherosclerosis of aorta: Secondary | ICD-10-CM | POA: Diagnosis not present

## 2021-11-12 DIAGNOSIS — I10 Essential (primary) hypertension: Secondary | ICD-10-CM | POA: Diagnosis not present

## 2021-11-12 DIAGNOSIS — E1169 Type 2 diabetes mellitus with other specified complication: Secondary | ICD-10-CM | POA: Diagnosis not present

## 2021-11-12 DIAGNOSIS — E782 Mixed hyperlipidemia: Secondary | ICD-10-CM | POA: Diagnosis not present

## 2022-03-04 DIAGNOSIS — L299 Pruritus, unspecified: Secondary | ICD-10-CM | POA: Diagnosis not present

## 2022-03-04 DIAGNOSIS — L570 Actinic keratosis: Secondary | ICD-10-CM | POA: Diagnosis not present

## 2022-03-04 DIAGNOSIS — L821 Other seborrheic keratosis: Secondary | ICD-10-CM | POA: Diagnosis not present

## 2022-03-04 DIAGNOSIS — D1801 Hemangioma of skin and subcutaneous tissue: Secondary | ICD-10-CM | POA: Diagnosis not present

## 2022-04-01 DIAGNOSIS — H6691 Otitis media, unspecified, right ear: Secondary | ICD-10-CM | POA: Diagnosis not present

## 2022-04-01 DIAGNOSIS — R059 Cough, unspecified: Secondary | ICD-10-CM | POA: Diagnosis not present

## 2022-04-01 DIAGNOSIS — J310 Chronic rhinitis: Secondary | ICD-10-CM | POA: Diagnosis not present

## 2022-05-01 DIAGNOSIS — H40013 Open angle with borderline findings, low risk, bilateral: Secondary | ICD-10-CM | POA: Diagnosis not present

## 2022-05-01 DIAGNOSIS — E119 Type 2 diabetes mellitus without complications: Secondary | ICD-10-CM | POA: Diagnosis not present

## 2022-06-09 DIAGNOSIS — Z0000 Encounter for general adult medical examination without abnormal findings: Secondary | ICD-10-CM | POA: Diagnosis not present

## 2022-06-09 DIAGNOSIS — Z125 Encounter for screening for malignant neoplasm of prostate: Secondary | ICD-10-CM | POA: Diagnosis not present

## 2022-06-09 DIAGNOSIS — E782 Mixed hyperlipidemia: Secondary | ICD-10-CM | POA: Diagnosis not present

## 2022-06-09 DIAGNOSIS — E1169 Type 2 diabetes mellitus with other specified complication: Secondary | ICD-10-CM | POA: Diagnosis not present

## 2022-06-09 DIAGNOSIS — I10 Essential (primary) hypertension: Secondary | ICD-10-CM | POA: Diagnosis not present

## 2022-06-09 DIAGNOSIS — Z23 Encounter for immunization: Secondary | ICD-10-CM | POA: Diagnosis not present

## 2022-09-09 DIAGNOSIS — E1165 Type 2 diabetes mellitus with hyperglycemia: Secondary | ICD-10-CM | POA: Diagnosis not present

## 2022-09-10 DIAGNOSIS — D2239 Melanocytic nevi of other parts of face: Secondary | ICD-10-CM | POA: Diagnosis not present

## 2022-09-10 DIAGNOSIS — L814 Other melanin hyperpigmentation: Secondary | ICD-10-CM | POA: Diagnosis not present

## 2022-09-10 DIAGNOSIS — D225 Melanocytic nevi of trunk: Secondary | ICD-10-CM | POA: Diagnosis not present

## 2022-09-10 DIAGNOSIS — B354 Tinea corporis: Secondary | ICD-10-CM | POA: Diagnosis not present

## 2022-12-10 DIAGNOSIS — Z23 Encounter for immunization: Secondary | ICD-10-CM | POA: Diagnosis not present

## 2022-12-10 DIAGNOSIS — E1169 Type 2 diabetes mellitus with other specified complication: Secondary | ICD-10-CM | POA: Diagnosis not present

## 2022-12-10 DIAGNOSIS — I10 Essential (primary) hypertension: Secondary | ICD-10-CM | POA: Diagnosis not present

## 2022-12-10 DIAGNOSIS — E782 Mixed hyperlipidemia: Secondary | ICD-10-CM | POA: Diagnosis not present

## 2022-12-10 DIAGNOSIS — I700 Atherosclerosis of aorta: Secondary | ICD-10-CM | POA: Diagnosis not present
